# Patient Record
Sex: Female | Born: 1970 | Race: Black or African American | Hispanic: No | Smoking: Never smoker
Health system: Southern US, Community
[De-identification: ages and names within clinical notes are randomized; demographics above are authoritative.]

## PROBLEM LIST (undated history)

## (undated) DIAGNOSIS — K5790 Diverticulosis of intestine, part unspecified, without perforation or abscess without bleeding: Secondary | ICD-10-CM

## (undated) DIAGNOSIS — D649 Anemia, unspecified: Secondary | ICD-10-CM

## (undated) DIAGNOSIS — E119 Type 2 diabetes mellitus without complications: Secondary | ICD-10-CM

## (undated) DIAGNOSIS — I10 Essential (primary) hypertension: Secondary | ICD-10-CM

## (undated) DIAGNOSIS — K219 Gastro-esophageal reflux disease without esophagitis: Secondary | ICD-10-CM

## (undated) HISTORY — PX: ABDOMINAL HYSTERECTOMY: SHX81

## (undated) HISTORY — PX: WISDOM TOOTH EXTRACTION: SHX21

## (undated) HISTORY — PX: TUBAL LIGATION: SHX77

## (undated) HISTORY — DX: Diverticulosis of intestine, part unspecified, without perforation or abscess without bleeding: K57.90

## (undated) HISTORY — DX: Gastro-esophageal reflux disease without esophagitis: K21.9

## (undated) HISTORY — DX: Anemia, unspecified: D64.9

---

## 2013-10-26 ENCOUNTER — Encounter (HOSPITAL_COMMUNITY): Payer: Self-pay | Admitting: Emergency Medicine

## 2013-10-26 ENCOUNTER — Emergency Department (HOSPITAL_COMMUNITY)
Admission: EM | Admit: 2013-10-26 | Discharge: 2013-10-26 | Disposition: A | Discharge status: Home / Self Care | Attending: Emergency Medicine | Admitting: Emergency Medicine

## 2013-10-26 DIAGNOSIS — J329 Chronic sinusitis, unspecified: Secondary | ICD-10-CM | POA: Insufficient documentation

## 2013-10-26 DIAGNOSIS — K5900 Constipation, unspecified: Secondary | ICD-10-CM | POA: Insufficient documentation

## 2013-10-26 DIAGNOSIS — R1084 Generalized abdominal pain: Secondary | ICD-10-CM | POA: Insufficient documentation

## 2013-10-26 DIAGNOSIS — R51 Headache: Secondary | ICD-10-CM | POA: Insufficient documentation

## 2013-10-26 DIAGNOSIS — R109 Unspecified abdominal pain: Secondary | ICD-10-CM

## 2013-10-26 LAB — URINALYSIS, ROUTINE W REFLEX MICROSCOPIC
Leukocytes, UA: NEGATIVE
Nitrite: NEGATIVE
Specific Gravity, Urine: 1.025 (ref 1.005–1.030)
pH: 5 (ref 5.0–8.0)

## 2013-10-26 LAB — COMPREHENSIVE METABOLIC PANEL
ALT: 20 U/L (ref 0–35)
Albumin: 3.8 g/dL (ref 3.5–5.2)
BUN: 11 mg/dL (ref 6–23)
CO2: 23 mEq/L (ref 19–32)
Calcium: 9.1 mg/dL (ref 8.4–10.5)
Creatinine, Ser: 0.67 mg/dL (ref 0.50–1.10)
Total Protein: 7.5 g/dL (ref 6.0–8.3)

## 2013-10-26 LAB — LIPASE, BLOOD: Lipase: 16 U/L (ref 11–59)

## 2013-10-26 LAB — CBC
Hemoglobin: 13.3 g/dL (ref 12.0–15.0)
MCHC: 34.5 g/dL (ref 30.0–36.0)
Platelets: 316 10*3/uL (ref 150–400)
RBC: 4.77 MIL/uL (ref 3.87–5.11)

## 2013-10-26 MED ORDER — AMOXICILLIN 500 MG PO CAPS: 500.0000 mg | ORAL_CAPSULE | Freq: Three times a day (TID) | ORAL | Status: DC

## 2013-10-26 NOTE — ED Notes (Signed)
2 different EMT/NT attempted to collect all of pt's ordered labs

## 2013-10-26 NOTE — ED Notes (Signed)
EDP at the bedside.  ?

## 2013-10-26 NOTE — ED Provider Notes (Signed)
CSN: [id number]     Arrival date & time 10/26/13  1142 History   First MD Initiated Contact with Patient 10/26/13 1500     Chief Complaint  Patient presents with  . Abdominal Pain  . Headache   (Consider location/radiation/quality/duration/timing/severity/associated sxs/prior Treatment) Patient is a 42 y.o. female presenting with abdominal pain and headaches.  Abdominal Pain Headache Associated symptoms: abdominal pain    Pt with no significant PMH reports 2 weeks of nasal and sinus congestion with moderate aching headache similar to previous episodes of sinusitis and not improved with Sudafed. She denies fever or cough. Reports post-nasal drip particularly at night.   She has a secondary complaint of intermittent moderate aching upper abdominal pain, radiating around to her lower back onset this morning. No associated vomiting or diarrhea, does report mild constipation recently and some mild dysuria but no increased urinary frequency. She is s/p hysterectomy. Still has gall bladder.   History reviewed. No pertinent past medical history. History reviewed. No pertinent past surgical history. History reviewed. No pertinent family history. History  Substance Use Topics  . Smoking status: Never Smoker   . Smokeless tobacco: Not on file  . Alcohol Use: Yes     Comment: occ   OB History   Grav Para Term Preterm Abortions TAB SAB Ect Mult Living                 Review of Systems  Gastrointestinal: Positive for abdominal pain.  Neurological: Positive for headaches.   All other systems reviewed and are negative except as noted in HPI.   Allergies  Review of patient's allergies indicates no known allergies.  Home Medications   Current Outpatient Rx  Name  Route  Sig  Dispense  Refill  . pseudoephedrine (SUDAFED) 120 MG 12 hr tablet   Oral   Take 120 mg by mouth every 12 (twelve) hours as needed for congestion.          BP 133/72  Pulse 112  Temp(Src) 97.6 F (36.4 C)  (Oral)  Resp 18  Wt 200 lb (90.719 kg)  SpO2 96% Physical Exam  Nursing note and vitals reviewed. Constitutional: She is oriented to person, place, and time. She appears well-developed and well-nourished.  HENT:  Head: Normocephalic and atraumatic.  Nose: Mucosal edema and rhinorrhea present. Right sinus exhibits maxillary sinus tenderness. Left sinus exhibits maxillary sinus tenderness.  Eyes: EOM are normal. Pupils are equal, round, and reactive to light.  Neck: Normal range of motion. Neck supple.  Cardiovascular: Normal rate, normal heart sounds and intact distal pulses.   Pulmonary/Chest: Effort normal and breath sounds normal.  Abdominal: Bowel sounds are normal. She exhibits no distension. There is tenderness (mild, diffuse). There is no rebound and no guarding.  Musculoskeletal: Normal range of motion. She exhibits no edema and no tenderness.  Neurological: She is alert and oriented to person, place, and time. She has normal strength. No cranial nerve deficit or sensory deficit.  Skin: Skin is warm and dry. No rash noted.  Psychiatric: She has a normal mood and affect.    ED Course  Procedures (including critical care time) Labs Review Labs Reviewed  COMPREHENSIVE METABOLIC PANEL - Abnormal; Notable for the following:    Sodium 134 (*)    All other components within normal limits  CBC - Abnormal; Notable for the following:    WBC 13.2 (*)    All other components within normal limits  LIPASE, BLOOD  URINALYSIS, ROUTINE W REFLEX MICROSCOPIC  CBC WITH DIFFERENTIAL   Imaging Review No results found.  EKG Interpretation   None       MDM   1. Sinusitis   2. Abdominal pain   3. Constipation     Labs unremarkable, abdomen benign, doubt acute surgical process. Likely sinusitis given duration of symptoms, will start Amoxil. Recommend continued decongestants, stool softeners for constipation and PCP followup.     Charles B. Bernette Mayers, MD 10/26/13 [phone]

## 2013-10-26 NOTE — ED Notes (Signed)
Pt c/o epigastric pain starting this am; pt sts some head congestion and HA x 1 week; pt denies cough

## 2013-10-26 NOTE — ED Notes (Signed)
Called lab to check on CBC. Informed it was not run yet, lab to run the specimen now.

## 2014-11-21 ENCOUNTER — Other Ambulatory Visit: Payer: Self-pay | Admitting: Family Medicine

## 2014-11-21 DIAGNOSIS — Z1231 Encounter for screening mammogram for malignant neoplasm of breast: Secondary | ICD-10-CM

## 2014-12-01 ENCOUNTER — Ambulatory Visit
Admission: RE | Admit: 2014-12-01 | Discharge: 2014-12-01 | Disposition: A | Discharge status: Home / Self Care | Source: Ambulatory Visit | Attending: Family Medicine | Admitting: Family Medicine

## 2014-12-01 DIAGNOSIS — Z1231 Encounter for screening mammogram for malignant neoplasm of breast: Secondary | ICD-10-CM

## 2014-12-02 ENCOUNTER — Emergency Department (HOSPITAL_COMMUNITY)
Admission: EM | Admit: 2014-12-02 | Discharge: 2014-12-02 | Disposition: A | Discharge status: Home / Self Care | Attending: Emergency Medicine | Admitting: Emergency Medicine

## 2014-12-02 ENCOUNTER — Encounter (HOSPITAL_COMMUNITY): Payer: Self-pay

## 2014-12-02 DIAGNOSIS — K088 Other specified disorders of teeth and supporting structures: Secondary | ICD-10-CM | POA: Diagnosis not present

## 2014-12-02 DIAGNOSIS — I10 Essential (primary) hypertension: Secondary | ICD-10-CM | POA: Diagnosis not present

## 2014-12-02 DIAGNOSIS — Z792 Long term (current) use of antibiotics: Secondary | ICD-10-CM | POA: Diagnosis not present

## 2014-12-02 DIAGNOSIS — K0889 Other specified disorders of teeth and supporting structures: Secondary | ICD-10-CM

## 2014-12-02 HISTORY — DX: Essential (primary) hypertension: I10

## 2014-12-02 MED ORDER — PENICILLIN V POTASSIUM 500 MG PO TABS: 500.0000 mg | ORAL_TABLET | Freq: Four times a day (QID) | ORAL | Status: AC

## 2014-12-02 MED ORDER — OXYCODONE-ACETAMINOPHEN 5-325 MG PO TABS: 1.0000 | ORAL_TABLET | Freq: Three times a day (TID) | ORAL | Status: DC | PRN

## 2014-12-02 NOTE — ED Provider Notes (Signed)
CSN: [id number]     Arrival date & time 12/02/14  1505 History   First MD Initiated Contact with Patient 12/02/14 1537     Chief Complaint  Patient presents with  . Dental Pain     (Consider location/radiation/quality/duration/timing/severity/associated sxs/prior Treatment) Patient is a 43 y.o. female presenting with tooth pain. The history is provided by the patient.  Dental Pain Location:  Generalized Quality:  Dull and aching Severity:  Mild Onset quality:  Gradual Duration:  1 month Timing:  Intermittent Progression:  Worsening Chronicity:  New Context comment:  Wisdom teeth Relieved by:  Nothing Worsened by:  Nothing tried Ineffective treatments:  None tried Associated symptoms: no congestion, no drooling, no fever, no headaches and no neck pain     Past Medical History  Diagnosis Date  . Hypertension    Past Surgical History  Procedure Laterality Date  . Abdominal hysterectomy     History reviewed. No pertinent family history. History  Substance Use Topics  . Smoking status: Never Smoker   . Smokeless tobacco: Not on file  . Alcohol Use: Yes     Comment: occ   OB History    No data available     Review of Systems  Constitutional: Negative for fever and fatigue.  HENT: Negative for congestion and drooling.   Eyes: Negative for pain.  Respiratory: Negative for cough and shortness of breath.   Cardiovascular: Negative for chest pain.  Gastrointestinal: Negative for nausea, vomiting, abdominal pain and diarrhea.  Genitourinary: Negative for dysuria and hematuria.  Musculoskeletal: Negative for back pain, gait problem and neck pain.  Skin: Negative for color change.  Neurological: Negative for dizziness and headaches.  Hematological: Negative for adenopathy.  Psychiatric/Behavioral: Negative for behavioral problems.  All other systems reviewed and are negative.     Allergies  Review of patient's allergies indicates no known allergies.  Home  Medications   Prior to Admission medications   Medication Sig Start Date End Date Taking? Authorizing Provider  amoxicillin (AMOXIL) 500 MG capsule Take 1 capsule (500 mg total) by mouth 3 (three) times daily. 10/26/13   Charles B. Bernette MayersSheldon, MD  pseudoephedrine (SUDAFED) 120 MG 12 hr tablet Take 120 mg by mouth every 12 (twelve) hours as needed for congestion.    Historical Provider, MD   BP 159/99 mmHg  Pulse 81  Temp(Src) 97.7 F (36.5 C) (Oral)  Resp 20  SpO2 100% Physical Exam  Constitutional: She is oriented to person, place, and time. She appears well-developed and well-nourished.  HENT:  Head: Normocephalic.  Mouth/Throat: Oropharynx is clear and moist. No oropharyngeal exudate.  No trismus. Normal-appearing dentition. No intraoral abscess noted. Normal-appearing posterior oropharynx.  Eyes: Conjunctivae and EOM are normal. Pupils are equal, round, and reactive to light.  Neck: Normal range of motion. Neck supple.  Cardiovascular: Normal rate, regular rhythm, normal heart sounds and intact distal pulses.  Exam reveals no gallop and no friction rub.   No murmur heard. Pulmonary/Chest: Effort normal and breath sounds normal. No respiratory distress. She has no wheezes.  Abdominal: Soft. Bowel sounds are normal. There is no tenderness. There is no rebound and no guarding.  Musculoskeletal: Normal range of motion. She exhibits no edema or tenderness.  Neurological: She is alert and oriented to person, place, and time.  Skin: Skin is warm and dry.  Psychiatric: She has a normal mood and affect. Her behavior is normal.  Nursing note and vitals reviewed.   ED Course  Procedures (including critical care  time) Labs Review Labs Reviewed - No data to display  Imaging Review No results found.   EKG Interpretation None      MDM   Final diagnoses:  Pain, dental    3:50 PM 43 y.o. female who presents with diffuse dental pain. She states that she has had teeth aching for the  last month. She has follow-up with a dentist but does not have dental insurance. She is currently waiting until January 2016 to get her wisdom teeth extracted. She denies any fevers. Her vital signs are unremarkable here. She has a normal dental exam. Will recommend she follow back up with her dentist. Will provide resources.  3:52 PM:  I have discussed the diagnosis/risks/treatment options with the patient and believe the pt to be eligible for discharge home to follow-up with her dentist. We also discussed returning to the ED immediately if new or worsening sx occur. We discussed the sx which are most concerning (e.g., worsening pain, swelling, fever) that necessitate immediate return. Medications administered to the patient during their visit and any new prescriptions provided to the patient are listed below.  Medications given during this visit Medications - No data to display  New Prescriptions   OXYCODONE-ACETAMINOPHEN (PERCOCET) 5-325 MG PER TABLET    Take 1 tablet by mouth every 8 (eight) hours as needed for moderate pain.   PENICILLIN V POTASSIUM (VEETID) 500 MG TABLET    Take 1 tablet (500 mg total) by mouth 4 (four) times daily.       Purvis SheffieldForrest Osceola Depaz, MD 12/02/14 901 [phone]

## 2014-12-02 NOTE — ED Notes (Signed)
Per pt, dental pain started today but has had pain on and off.  Pt called dentist but they were closing and told her to come here.

## 2014-12-02 NOTE — Discharge Instructions (Signed)
Dental Pain °A tooth ache may be caused by cavities (tooth decay). Cavities expose the nerve of the tooth to air and hot or cold temperatures. It may come from an infection or abscess (also called a boil or furuncle) around your tooth. It is also often caused by dental caries (tooth decay). This causes the pain you are having. °DIAGNOSIS  °Your caregiver can diagnose this problem by exam. °TREATMENT  °· If caused by an infection, it may be treated with medications which kill germs (antibiotics) and pain medications as prescribed by your caregiver. Take medications as directed. °· Only take over-the-counter or prescription medicines for pain, discomfort, or fever as directed by your caregiver. °· Whether the tooth ache today is caused by infection or dental disease, you should see your dentist as soon as possible for further care. °SEEK MEDICAL CARE IF: °The exam and treatment you received today has been provided on an emergency basis only. This is not a substitute for complete medical or dental care. If your problem worsens or new problems (symptoms) appear, and you are unable to meet with your dentist, call or return to this location. °SEEK IMMEDIATE MEDICAL CARE IF:  °· You have a fever. °· You develop redness and swelling of your face, jaw, or neck. °· You are unable to open your mouth. °· You have severe pain uncontrolled by pain medicine. °MAKE SURE YOU:  °· Understand these instructions. °· Will watch your condition. °· Will get help right away if you are not doing well or get worse. °Document Released: 12/02/2005 Document Revised: 02/24/2012 Document Reviewed: 07/20/2008 °ExitCare® Patient Information ©2015 [employer]. This information is not intended to replace advice given to you by your health care provider. Make sure you discuss any questions you have with your health care provider. ° °Emergency Department Resource Guide °1) Find a Doctor and Pay Out of Pocket °Although you won't have to find out who  is covered by your insurance plan, it is a good idea to ask around and get recommendations. You will then need to call the office and see if the doctor you have chosen will accept you as a new patient and what types of options they offer for patients who are self-pay. Some doctors offer discounts or will set up payment plans for their patients who do not have insurance, but you will need to ask so you aren't surprised when you get to your appointment. ° °2) Contact Your Local Health Department °Not all health departments have doctors that can see patients for sick visits, but many do, so it is worth a call to see if yours does. If you don't know where your local health department is, you can check in your phone book. The CDC also has a tool to help you locate your state's health department, and many state websites also have listings of all of their local health departments. ° °3) Find a Walk-in Clinic °If your illness is not likely to be very severe or complicated, you may want to try a walk in clinic. These are popping up all over the country in pharmacies, drugstores, and shopping centers. They're usually staffed by nurse practitioners or physician assistants that have been trained to treat common illnesses and complaints. They're usually fairly quick and inexpensive. However, if you have serious medical issues or chronic medical problems, these are probably not your best option. ° °No Primary Care Doctor: °- Call Health Connect at  832-8000 - they can help you locate a primary   care doctor that  accepts your insurance, provides certain services, etc. °- Physician Referral Service- 1-800-533-3463 ° °Chronic Pain Problems: °Organization         Address  Phone   Notes  °[facility]  (336) 297-2271 Patients need to be referred by their primary care doctor.  ° °Medication Assistance: °Organization         Address  Phone   Notes  °[place] [facility] [address]  [address] °[place], [place] [address] (336) 641-8030 --Must be a resident of [place] °-- Must have NO insurance coverage whatsoever (no Medicaid/ Medicare, etc.) °-- The pt. MUST have a primary care doctor that directs their care regularly and follows them in the community °  °[facility]  (866) 331-1348   °[employer]  (888) 892-1162   ° °Agencies that provide inexpensive medical care: °Organization         Address  Phone   Notes  °[facility]  (336) 832-8035   °[facility]    (336) 832-7272   °[facility] [address] °[place], [place] [address] (336) 832-4777   °[facility] [address], °[place] (336) 271-4999   °[facility]    (336) 373-0678   °[facility]    (336) 272-1050   °[facility] ° [address], [place] Phone:  (336) 832-4444, Fax:  (336) 832-4440 Hours of Operation:  9 am - 6 pm, M-F.  Also accepts Medicaid/Medicare and self-pay.  °[facility] ° [address], [place] Phone: (336) 832-3150, Fax: (336) 832-3151. Hours of Operation:  8:30 am - 5:30 pm, M-F.  Also accepts Medicaid and self-pay.  °[facility] [place] [address], [place] Phone: (336) 878-6027   °[facility] [address], [place], [place] (336)723-1848, Ext. 123 Mondays & Thursdays: 7-9 AM.  First 15 patients are seen on a first come, first serve basis. °  ° °Medicaid-accepting [place] Providers: ° °Organization         Address  Phone   Notes  °[facility] [address], [place] (336) 641-2100 Also accepts self-pay patients.  °[facility] [address], [place] ° (336) 856-9996   °[facility] [address], [place] (336) 288-8857   °[facility] [address] [place] [address], [place] (336) 299-7000   °Veita Bland [address], [place]  ° (336) 373-1557 Only accepts [place] Access Medicaid patients after they have their name applied to their card.  ° °Self-Pay (no insurance) in [place]: ° °Organization         Address  Phone   Notes  °Sickle Cell Patients, [facility] [address], [place] (336) 832-1970   °[facility] [address], [place] (336) 832-4400   °[facility]  ° [address],  (336) 992-4800   °[facility]/Dr. Osei-Bonsu ° [address] [place] [address], [place] or [address], [place] (336) 841-8500 Phone number for both [place] and [place] locations is the same.  °[facility] [address], [place] (336) 299-0000   °[facility] [address] [place] [address],  or [address] (336) 852-7530 °(336) 878-2260   °  [facility] [address], [place] (336) 350-1642, phone; (336) 294-5005, fax Sees patients 1st and 3rd Saturday of every month.  Must not qualify for public or private insurance (i.e. Medicaid, Medicare, [place] Health Choice, Veterans' Benefits) • Household income should be no more than 200% of the poverty level •The clinic cannot treat you if you are pregnant or think you are pregnant • Sexually transmitted diseases are not treated at the clinic.  ° ° °Dental Care: °Organization         Address  Phone  Notes  °[facility] [address], [place] (336) 641-6152 Accepts children up to age 21 who are enrolled in Medicaid or [place] Health Choice; pregnant women with a Medicaid card; and children who have applied for Medicaid or [place] Health Choice, but were declined, whose parents can pay a reduced fee at time of service.  °[facility] [place]  [address], [place] (336) 641-7733 Accepts children up to age 21 who are  enrolled in Medicaid or [place] Health Choice; pregnant women with a Medicaid card; and children who have applied for Medicaid or [place] Health Choice, but were declined, whose parents can pay a reduced fee at time of service.  °[facility] ° [address], [place] (336) 641-4533 Patients are seen by appointment only. Walk-ins are not accepted. [facility] will see patients 18 years of age and older. °Monday - Tuesday (8am-5pm) °Most Wednesdays (8:30-5pm) °$30 per visit, cash only  °[facility] ° [address], [place] (336) 641-4533 Patients are seen by appointment only. Walk-ins are not accepted. [facility] will see patients 18 years of age and older. °One Wednesday Evening (Monthly: Volunteer Based).  $30 per visit, cash only  °[facility]  (919) 537-3737 for adults; Children under age 4, call Graduate Pediatric Dentistry at (919) [phone]. Children aged 4-14, please call (919) [phone] to request a pediatric application. ° Dental services are provided in all areas of dental care including fillings, crowns and bridges, complete and partial dentures, implants, gum treatment, root canals, and extractions. Preventive care is also provided. Treatment is provided to both adults and children. °Patients are selected via a lottery and there is often a waiting list. °  °[facility] [address], °[place] ° (336) 763-8833 [email or web address] °  °[facility] [address], [place], [place] (336)723-1848, Ext. 123 Second and Fourth Thursday of each month, opens at 6:30 AM; Clinic ends at 9 AM.  Patients are seen on a first-come first-served basis, and a limited number are seen during each clinic.  ° °[facility] ° [address], [place], [place] (336) 723-7904   Eligibility Requirements °You must have lived in [place], [place], or [place] counties for at least the last three months. °  You  cannot be eligible for state or federal sponsored healthcare insurance, including Veterans Administration, Medicaid, or Medicare. °  You generally cannot be eligible for healthcare insurance through your employer.  °  How to apply: °Eligibility screenings are held every Tuesday and Wednesday afternoon from 1:00 pm until 4:00 pm. You do not need an appointment for the interview!  °[facility] [address], [place],  336-631-2330   °[facility]  336-342-8273   °[facility]  336-703-3100   °[facility]  336-570-6415   ° °Behavioral Health Resources in the Community: °Intensive Outpatient Programs °Organization         Address  Phone  Notes  °[facility] [address], [place], [place] 336-878-6098   °[facility] [address], [place], [place] 336-832-9800   °[facility] [address], [place], [place] ° 336-882-2125   °[facility] [address],  °[place], [place] 1-800-853-5163 or 336-641-4981   °Substance Abuse Resources °Organization         Address  Phone  Notes  °[facility]  336-882-2125   °[facility]  336-784-9470   °[facility]  336-285-9073   °[facility]  336-845-3988   °Residential & Outpatient Substance Abuse Program  1-800-659-3381   °Psychological Services °Organization         Address  Phone  Notes  °[facility]  336- 832-9600   °[facility]  336- 378-7881   °[facility] [address], [place] 1-800-853-5163 or 336-641-4981   ° °Mobile Crisis Teams °Organization         Address  Phone  Notes  °[facility], Mobile Crisis Care Unit  1-877-626-1772   °Assertive °[facility] ° [address] [place], [place] 336-834-9664   °Sharon DeEsch [address] °[place] 336-554-5454   ° °Self-Help/Support  Groups °Organization         Address  Phone             Notes  °[facility] [place] - variety of support groups  336- 373-1402 Call for more information  °Narcotics Anonymous (NA), [facility] [address], °[place]  2 meetings at this location  ° °Residential Treatment Programs °Organization         Address  Phone  Notes  °[facility] [address],    °[place]  1-866-801-8205   °[facility] ° [address], [place], [place] 704-293-8524   °[facility] [address], [place] 336-845-3988 Admissions: 8am-3pm M-F  °[facility] [address]    °[place], [place] 336-841-1104   °[facility] [address], [place], [place] 336-379-7146   °[facility] [address]  °[place], [place] 336-285-9073   °[facility] - Intensive Outpatient [address], [place], [place] 336-852-3033   °[facility] [address]  °[place], [place] 1-877-615-2722 or 336-784-9470   °[facility] [address] [place], [place] 336-227-7417 Accepts Medicaid  °[facility] [address]  ° [place] 1-800-659-3381 Substance Abuse/Addiction Treatment  ° °[place] Behavioral Health Resources °Organization         Address  Phone  Notes  °[facility]  (888) 581-9988   °Julie Brannon, PhD [address] [place], [place]   (336) 349-5553 or (336) 951-0000   °[facility]   [address] °[place], [place] (336) 349-4454   °[facility] [address], [place], [place] (336) 342-8316 Insurance/Medicaid/sponsorship through [facility]  °[facility] [address]                                    [place],  (336) 342-8316 Therapy/tele-psych/case  °[facility]   [address]  ° [place], [place] (336) 349-2233    °Dr. Arfeen  (336) 349-4544   °[facility] [place]  [facility]  [employer] [place] [facility] 1) [address], [place] °2) [address], [place] °3)  [address], [place] (336) 349-3220 °(336) 342-7768 ° °(336) 342-8140   °[place] Child Abuse Hotline (336) 342-1394 or (336) 342-3537 (After Hours)    ° ° ° °

## 2014-12-13 ENCOUNTER — Other Ambulatory Visit: Payer: Self-pay | Admitting: Family Medicine

## 2014-12-13 DIAGNOSIS — R928 Other abnormal and inconclusive findings on diagnostic imaging of breast: Secondary | ICD-10-CM

## 2014-12-26 ENCOUNTER — Ambulatory Visit
Admission: RE | Admit: 2014-12-26 | Discharge: 2014-12-26 | Disposition: A | Discharge status: Home / Self Care | Source: Ambulatory Visit | Attending: Family Medicine | Admitting: Family Medicine

## 2014-12-26 DIAGNOSIS — R928 Other abnormal and inconclusive findings on diagnostic imaging of breast: Secondary | ICD-10-CM

## 2015-01-02 DIAGNOSIS — K5790 Diverticulosis of intestine, part unspecified, without perforation or abscess without bleeding: Secondary | ICD-10-CM

## 2015-01-02 HISTORY — DX: Diverticulosis of intestine, part unspecified, without perforation or abscess without bleeding: K57.90

## 2015-01-23 DIAGNOSIS — K219 Gastro-esophageal reflux disease without esophagitis: Secondary | ICD-10-CM

## 2015-01-23 HISTORY — DX: Gastro-esophageal reflux disease without esophagitis: K21.9

## 2015-06-06 ENCOUNTER — Other Ambulatory Visit: Payer: Self-pay | Admitting: Family Medicine

## 2015-06-06 DIAGNOSIS — E049 Nontoxic goiter, unspecified: Secondary | ICD-10-CM

## 2015-06-09 ENCOUNTER — Ambulatory Visit
Admission: RE | Admit: 2015-06-09 | Discharge: 2015-06-09 | Disposition: A | Discharge status: Home / Self Care | Source: Ambulatory Visit | Attending: Family Medicine | Admitting: Family Medicine

## 2015-06-09 DIAGNOSIS — E049 Nontoxic goiter, unspecified: Secondary | ICD-10-CM

## 2015-06-13 ENCOUNTER — Other Ambulatory Visit: Payer: Self-pay | Admitting: Family Medicine

## 2015-06-13 DIAGNOSIS — E041 Nontoxic single thyroid nodule: Secondary | ICD-10-CM

## 2015-07-06 ENCOUNTER — Encounter: Payer: Self-pay | Admitting: Physician Assistant

## 2015-07-07 ENCOUNTER — Other Ambulatory Visit: Payer: Self-pay | Admitting: Cardiology

## 2015-07-07 DIAGNOSIS — R079 Chest pain, unspecified: Secondary | ICD-10-CM

## 2015-07-12 ENCOUNTER — Other Ambulatory Visit (HOSPITAL_COMMUNITY)
Admission: RE | Admit: 2015-07-12 | Discharge: 2015-07-12 | Disposition: A | Discharge status: Home / Self Care | Source: Ambulatory Visit | Attending: Interventional Radiology | Admitting: Interventional Radiology

## 2015-07-12 ENCOUNTER — Ambulatory Visit
Admission: RE | Admit: 2015-07-12 | Discharge: 2015-07-12 | Disposition: A | Discharge status: Home / Self Care | Source: Ambulatory Visit | Attending: Family Medicine | Admitting: Family Medicine

## 2015-07-12 DIAGNOSIS — E041 Nontoxic single thyroid nodule: Secondary | ICD-10-CM

## 2015-07-14 ENCOUNTER — Encounter (HOSPITAL_COMMUNITY)
Admission: RE | Admit: 2015-07-14 | Discharge: 2015-07-14 | Disposition: A | Discharge status: Home / Self Care | Source: Ambulatory Visit | Attending: Cardiology | Admitting: Cardiology

## 2015-07-14 DIAGNOSIS — I10 Essential (primary) hypertension: Secondary | ICD-10-CM | POA: Insufficient documentation

## 2015-07-14 DIAGNOSIS — R079 Chest pain, unspecified: Secondary | ICD-10-CM | POA: Diagnosis not present

## 2015-07-14 MED ORDER — TECHNETIUM TC 99M SESTAMIBI GENERIC - CARDIOLITE
30.0000 | Freq: Once | INTRAVENOUS | Status: AC | PRN
  Administered 2015-07-14: 30 via INTRAVENOUS

## 2015-07-14 MED ORDER — TECHNETIUM TC 99M SESTAMIBI GENERIC - CARDIOLITE
10.0000 | Freq: Once | INTRAVENOUS | Status: AC | PRN
  Administered 2015-07-14: 10 via INTRAVENOUS

## 2015-07-17 ENCOUNTER — Encounter: Payer: Self-pay | Admitting: *Deleted

## 2015-07-21 ENCOUNTER — Encounter: Payer: Self-pay | Admitting: Physician Assistant

## 2015-07-21 ENCOUNTER — Ambulatory Visit (INDEPENDENT_AMBULATORY_CARE_PROVIDER_SITE_OTHER): Admitting: Physician Assistant

## 2015-07-21 VITALS — BP 124/70 | HR 62 | Ht 62.0 in | Wt 188.0 lb

## 2015-07-21 DIAGNOSIS — K219 Gastro-esophageal reflux disease without esophagitis: Secondary | ICD-10-CM | POA: Diagnosis not present

## 2015-07-21 DIAGNOSIS — K5730 Diverticulosis of large intestine without perforation or abscess without bleeding: Secondary | ICD-10-CM

## 2015-07-21 DIAGNOSIS — R1310 Dysphagia, unspecified: Secondary | ICD-10-CM

## 2015-07-21 NOTE — Patient Instructions (Addendum)
You have been scheduled for a Barium Esophogram at [facility] [address] [facility]  on 07/25/2015 at 10:40 for an 11am appointment. Please arrive 15 minutes prior to your appointment for registration. Make certain not to have anything to eat or drink 6 hours prior to your test. If you need to reschedule for any reason, please contact radiology at [phone] to do so. __________________________________________________________________ A barium swallow is an examination that concentrates on views of the esophagus. This tends to be a double contrast exam (barium and two liquids which, when combined, create a gas to distend the wall of the oesophagus) or single contrast (non-ionic iodine based). The study is usually tailored to your symptoms so a good history is essential. Attention is paid during the study to the form, structure and configuration of the esophagus, looking for functional disorders (such as aspiration, dysphagia, achalasia, motility and reflux) EXAMINATION You may be asked to change into a gown, depending on the type of swallow being performed. A radiologist and radiographer will perform the procedure. The radiologist will advise you of the type of contrast selected for your procedure and direct you during the exam. You will be asked to stand, sit or lie in several different positions and to hold a small amount of fluid in your mouth before being asked to swallow while the imaging is performed .In some instances you may be asked to swallow barium coated marshmallows to assess the motility of a solid food bolus. The exam can be recorded as a digital or video fluoroscopy procedure. POST PROCEDURE It will take 1-2 days for the barium to pass through your system. To facilitate this, it is important, unless otherwise directed, to increase your fluids for the next 24-48hrs and to resume your normal diet.  This test typically takes about 30 minutes to  perform. __________________________________________________________________________________  Kelly Cooper have been scheduled for an endoscopy. Please follow written instructions given to you at your visit today. If you use inhalers (even only as needed), please bring them with you on the day of your procedure. Your physician has requested that you go to www.startemmi.com and enter the access code given to you at your visit today. This web site gives a general overview about your procedure. However, you should still follow specific instructions given to you by our office regarding your preparation for the procedure.

## 2015-07-23 ENCOUNTER — Encounter: Payer: Self-pay | Admitting: Physician Assistant

## 2015-07-23 DIAGNOSIS — R1310 Dysphagia, unspecified: Secondary | ICD-10-CM | POA: Insufficient documentation

## 2015-07-23 DIAGNOSIS — K5730 Diverticulosis of large intestine without perforation or abscess without bleeding: Secondary | ICD-10-CM | POA: Insufficient documentation

## 2015-07-23 NOTE — Progress Notes (Signed)
Agree with initial assessment and plans 

## 2015-07-23 NOTE — Progress Notes (Signed)
Patient ID: Kelly Cooper, female   DOB: 1971/07/04, 44 y.o.   MRN: [id number] StandleyPI:  Kelly Cooper is a 44 y.o.   female  referred by Lewis Moccasin, MD  for evaluation of GERD and recent episode of diverticulitis. Patient has a past history of GERD and hypertension and has had an abdominal hysterectomy, tubal ligation, and extraction of wisdom teeth.  She states that in January 2016 she was evaluated at [facility] for lower abdominal pain. She had a CT scan that showed uncomplicated diverticulitis. She was prescribed a course of Flagyl and Cipro for 10 days. With these medications, she had resolution of her pain. She now has intermittent crampy pain prior to bowel movements, alleviated with defecation. She has had scant amounts of blood on the toilet tissue only if she strains. She has never had a colonoscopy. She is not aware of a family history of inflammatory bowel disease, and states her maternal grandfather had colon cancer at 65.  She also reports that she has been having intermittent chest pain for several months. She has been experiencing difficulty swallowing solids and dry foods and has to stop and let her food pass. She frequently gets mouthfuls of bitter regurgitation. She denies dysphagia to liquids and does not have a nocturnal cough, she has a bad taste in her mouth every morning. She had tried Prevacid and Prilosec in the past with no relief. She was then prescribed DEXA Micah Noel but her co-pay was too high for her to afford it. She has never had an EGD. She denies a family history of esophageal cancer. She denies use of tobacco or intravenous drugs and admits to rare use of alcohol. She has been experiencing epigastric pain that is worse after meals. She is full after 1 or 2 bites and has lost 23 pounds over 2 months. Through this time she states she had her wisdom teeth pulled, her episode of diverticulitis, and an allergy to a blood pressure medication. She admits to using Goody powders 5  or 6 times per week. She had a thyroid cyst biopsied and drained last week at [facility] imaging, and has not noted any decrease in her dysphagia since then.  Past Medical History  Diagnosis Date  . Hypertension   . GERD (gastroesophageal reflux disease) 01/23/2015  . Diverticulosis of intestine 01/02/2015  . Anemia     Past Surgical History  Procedure Laterality Date  . Abdominal hysterectomy      partial   . Tubal ligation    . Wisdom tooth extraction      one tooth removed   Family History  Problem Relation Age of Onset  . Diabetes Maternal Grandfather   . Diabetes Paternal Aunt   . Diabetes Paternal Uncle   . Kidney disease Father   . Lung cancer Father   . Colon cancer Maternal Grandfather   . Prostate cancer Maternal Grandfather    History  Substance Use Topics  . Smoking status: Never Smoker   . Smokeless tobacco: Not on file  . Alcohol Use: 0.0 oz/week    0 Standard drinks or equivalent per week     Comment: occ   Current Outpatient Prescriptions  Medication Sig Dispense Refill  . esomeprazole (NEXIUM) 40 MG capsule Take 1 capsule (40 mg total) by mouth daily at 12 noon. 30 minutes before breakfast 30 capsule 3   No current facility-administered medications for this visit.   Allergies  Allergen Reactions  . Losartan Cough  Hoarseness and coughing and weight loss     Review of Systems: Gen: Denies any fever, chills, sweats, anorexia, fatigue, weakness, malaise, and sleep disorder CV: Denies chest pain, angina, palpitations, syncope, orthopnea, PND, peripheral edema, and claudication. Resp: Denies dyspnea at rest, dyspnea with exercise, cough, sputum, wheezing, coughing up blood, and pleurisy. GI: Denies vomiting blood, jaundice, and fecal incontinence.   Has dysphagia to solids. Has early satiety. GU : Denies urinary burning, blood in urine, urinary frequency, urinary hesitancy, nocturnal urination, and urinary incontinence. MS: Denies joint pain,  limitation of movement, and swelling, stiffness, low back pain, extremity pain. Denies muscle weakness, cramps, atrophy.  Derm: Denies rash, itching, dry skin, hives, moles, warts, or unhealing ulcers.  Psych: Denies depression, anxiety, memory loss, suicidal ideation, hallucinations, paranoia, and confusion. Heme: Denies bruising, bleeding, and enlarged lymph nodes. Neuro:  Denies any headaches, dizziness, paresthesias. Endo:  Denies any problems with DM, thyroid, adrenal function  Studies: Nm Myocar Multi W/spect W/wall Motion / Ef  07/14/2015   CLINICAL DATA:  44 year old female with unspecified chest pain. Additional risk factors include hypertension.  EXAM: MYOCARDIAL IMAGING WITH SPECT (REST AND EXERCISE)  GATED LEFT VENTRICULAR WALL MOTION STUDY  LEFT VENTRICULAR EJECTION FRACTION  TECHNIQUE: Standard myocardial SPECT imaging was performed after resting intravenous injection of 10 mCi Tc-19m sestamibi. Subsequently, exercise tolerance test was performed by the patient under the supervision of the Cardiology staff. At peak-stress, 30 mCi Tc-43m sestamibi was injected intravenously and standard myocardial SPECT imaging was performed. Quantitative gated imaging was also performed to evaluate left ventricular wall motion, and estimate left ventricular ejection fraction.  COMPARISON:  None.  FINDINGS: Perfusion: No decreased activity in the left ventricle on stress imaging to suggest reversible ischemia or infarction.  Wall Motion: Normal left ventricular wall motion. No left ventricular dilation.  Left Ventricular Ejection Fraction: 76 %  End diastolic volume 52 ml  End systolic volume 13 ml  IMPRESSION: 1. No reversible ischemia or infarction.  2. Normal left ventricular wall motion.  3. Left ventricular ejection fraction 76%  4. Low-risk stress test findings*.  *2012 Appropriate Use Criteria for Coronary Revascularization Focused Update: J Am Coll Cardiol. 2012;59(9):857-881.  http://content.dementiazones.com.aspx?articleid=[email or web address]   Electronically Signed   By: Malachy Moan M.D.   On: 07/14/2015 14:57   US Thyroid Biopsy  07/12/2015   CLINICAL DATA:  44 year old female with a history of cystic lesion of the right thyroid. She has been referred for biopsy.  EXAM: ULTRASOUND GUIDED NEEDLE ASPIRATE BIOPSY OF THE THYROID GLAND  COMPARISON:  Ultrasound 06/09/2015  PROCEDURE: The procedure, risks, benefits, and alternatives were explained to the patient. Questions regarding the procedure were encouraged and answered. The patient understands and consents to the procedure.  Ultrasound survey was performed with images stored and sent to PACs.  The right neck was prepped with Betadine in a sterile fashion, and a sterile drape was applied covering the operative field. A sterile gown and sterile gloves were used for the procedure. Local anesthesia was provided with 1% Lidocaine.  Ultrasound guidance was used to infiltrate the region with 1% lidocaine for local anesthesia. Four separate 25 gauge fine needle biopsy were then acquired of the margin of the cystic right thyroid nodule using ultrasound guidance. In addition, a single 18 gauge needle was passed into the cystic portion with aspiration or proximal 10 cc of dark fluid. Images were stored.  Slide preparation was performed.  Final image was stored after biopsy.  Patient tolerated the procedure well and  remained hemodynamically stable throughout.  No complications were encountered and no significant blood loss was encounter  COMPLICATIONS: None.  FINDINGS: Ultrasound survey demonstrates cystic lesion of the right thyroid.  Images during the case demonstrate needle tip within the thyroid tissue on each pass.  Final image demonstrates collapse of the cystic lesion, with no complicating features.  IMPRESSION: Status post ultrasound-guided biopsy of right-sided cystic thyroid nodule, also with aspiration of cystic contents. Tissue  specimen sent to pathology for complete histopathologic analysis.  Signed,  Yvone Neu. Loreta Ave, DO  [facility]  [facility]   Electronically Signed   By: Gilmer Mor D.O.   On: 07/12/2015 12:19    LAB RESULTS: CBC 12/28/2014 white count 10.5, hemoglobin 12.8, hematocrit 39.8, platelets 342,000.  Physical Exam: BP 124/70 mmHg  Pulse 62  Ht 5\' 2"  (1.575 m)  Wt 188 lb (85.276 kg)  BMI 34.38 kg/m2 Constitutional: Pleasant,well-developed,female in no acute distress. HEENT: Normocephalic and atraumatic. Conjunctivae are normal. No scleral icterus. Neck supple.  Cardiovascular: Normal rate, regular rhythm.  Pulmonary/chest: Effort normal and breath sounds normal. No wheezing, rales or rhonchi. Abdominal: Soft, nondistended, nontender. Bowel sounds active throughout. There are no masses palpable. No hepatomegaly. Rectal: Brown stool heme negative Extremities: no edema Lymphadenopathy: No cervical adenopathy noted. Neurological: Alert and oriented to person place and time. Skin: Skin is warm and dry. No rashes noted. Psychiatric: Normal mood and affect. Behavior is normal.  ASSESSMENT AND PLAN: #1. Dysphagia. Possibly secondary to poorly controlled reflux. An antireflux regimen has been reviewed and she will be given a trial of Nexium 40 mg 1 by mouth every morning 30 minutes prior to breakfast. She will be scheduled for a barium swallow with tablet to evaluate for possible stricture, esophageal wall thickening, etc. She will then be scheduled for an EGD to evaluate for esophagitis, gastritis, ulcer, etc.The risks, benefits, and alternatives to endoscopy with possible biopsy and possible dilation were discussed with the patient and they consent to proceed.   #2. Diverticulosis. She's been instructed to adhere to a high-fiber, low-fat diet. She's been instructed to avoid constipation. She has signed a medical release to get records from [facility].  Further  recommendations will be made pending the findings of the above.    Dakotah Orrego, Tollie Pizza PA-C 07/23/2015, 6:05 PM  CC: Lewis Moccasin, MD

## 2015-07-25 ENCOUNTER — Ambulatory Visit
Admission: RE | Admit: 2015-07-25 | Discharge: 2015-07-25 | Disposition: A | Discharge status: Home / Self Care | Source: Ambulatory Visit | Attending: Physician Assistant | Admitting: Physician Assistant

## 2015-07-25 DIAGNOSIS — R1310 Dysphagia, unspecified: Secondary | ICD-10-CM

## 2015-08-29 ENCOUNTER — Telehealth: Payer: Self-pay | Admitting: *Deleted

## 2015-08-29 NOTE — Telephone Encounter (Signed)
LM for the patient to advise we moved her EGD Scheduled for  10-4 at 4:00 PM is now on 10-4-at 7:30 am. Asked patient to please call me back.

## 2015-08-30 NOTE — Telephone Encounter (Signed)
I called the patient and explained we had to change her EGD appointment on 09-19-2015 from 4:00 PM to 7:30 AM.   The patient was willing to do this and said that will be fine with her.  I advised she is to arrive at 7:00 am and have no food after midnight and nothing by mouth after 4:30 AM. Patient verbalized understanding the instructions.

## 2015-09-19 ENCOUNTER — Encounter: Admitting: Internal Medicine

## 2015-09-19 ENCOUNTER — Encounter: Payer: Self-pay | Admitting: Internal Medicine

## 2015-09-19 ENCOUNTER — Ambulatory Visit (AMBULATORY_SURGERY_CENTER): Admitting: Internal Medicine

## 2015-09-19 VITALS — BP 119/87 | HR 84 | Temp 97.0°F | Resp 81 | Ht 62.0 in | Wt 188.0 lb

## 2015-09-19 DIAGNOSIS — K219 Gastro-esophageal reflux disease without esophagitis: Secondary | ICD-10-CM

## 2015-09-19 DIAGNOSIS — R1310 Dysphagia, unspecified: Secondary | ICD-10-CM

## 2015-09-19 MED ORDER — SODIUM CHLORIDE 0.9 % IV SOLN: 500.0000 mL | INTRAVENOUS | Status: DC

## 2015-09-19 NOTE — Patient Instructions (Signed)

## 2015-09-19 NOTE — Op Note (Signed)
[facility] [address] [place], [address]   ENDOSCOPY PROCEDURE REPORT  PATIENT: Kelly, Cooper  MR#: [id number] BIRTHDATE: 10-02-1971 , 44  yrs. old GENDER: female ENDOSCOPIST: Roxy Cedar, MD REFERRED BY:  Maryelizabeth Rowan, M.D. PROCEDURE DATE:  09/19/2015 PROCEDURE:  EGD, diagnostic ASA CLASS:     Class I INDICATIONS:  dyspepsia, chest pain, and dysphagia. Since office visit doing better with improved appetite and weight gain. Barium esophagram with tablet was unremarkable. Still with belching and gas. MEDICATIONS: Monitored anesthesia care and Propofol 140 mg IV TOPICAL ANESTHETIC: none  DESCRIPTION OF PROCEDURE: After the risks benefits and alternatives of the procedure were thoroughly explained, informed consent was obtained.  The LB [id number] endoscope was introduced through the mouth and advanced to the second portion of the duodenum , Without limitations.  The instrument was slowly withdrawn as the mucosa was fully examined.     EXAM: The esophagus and gastroesophageal junction were completely normal in appearance.  The stomach was entered and closely examined.The antrum, angularis, and lesser curvature were well visualized, including a retroflexed view of the cardia and fundus. The stomach wall was normally distensable.  The scope passed easily through the pylorus into the duodenum.  Retroflexed views revealed no abnormalities.     The scope was then withdrawn from the patient and the procedure completed.  COMPLICATIONS: There were no immediate complications.  ENDOSCOPIC IMPRESSION: 1. Normal EGD  RECOMMENDATIONS: 1.  Anti-reflux regimen to be followed 2.  Continue Nexium 3. Make a routine office follow-up appointment with Dr. Marina Goodell in 3 months  REPEAT EXAM:  eSigned:  Roxy Cedar, MD 09/19/2015 7:57 AM    [id number] Duanne Guess, MD and The Patient

## 2015-09-19 NOTE — Progress Notes (Signed)
Transferred to recovery room. A/O x3, pleased with MAC.  VSS.  Report to Jane, RN. 

## 2015-09-20 ENCOUNTER — Telehealth: Payer: Self-pay | Admitting: *Deleted

## 2015-09-20 NOTE — Telephone Encounter (Signed)
  Follow up Call-  Call back number 09/19/2015  Post procedure Call Back phone  # [phone]  Permission to leave phone message Yes     Patient questions:  Do you have a fever, pain , or abdominal swelling? No. Pain Score  0 *  Have you tolerated food without any problems? Yes.    Have you been able to return to your normal activities? Yes.    Do you have any questions about your discharge instructions: Diet   No. Medications  No. Follow up visit  No.  Do you have questions or concerns about your Care? No.  Actions: * If pain score is 4 or above: No action needed, pain <4.

## 2015-11-23 ENCOUNTER — Other Ambulatory Visit: Payer: Self-pay | Admitting: Internal Medicine

## 2015-11-23 ENCOUNTER — Other Ambulatory Visit: Payer: Self-pay

## 2015-11-23 ENCOUNTER — Other Ambulatory Visit: Payer: Self-pay | Admitting: Family Medicine

## 2015-11-23 DIAGNOSIS — N63 Unspecified lump in unspecified breast: Secondary | ICD-10-CM

## 2015-11-23 DIAGNOSIS — E041 Nontoxic single thyroid nodule: Secondary | ICD-10-CM

## 2015-11-28 ENCOUNTER — Ambulatory Visit
Admission: RE | Admit: 2015-11-28 | Discharge: 2015-11-28 | Disposition: A | Discharge status: Home / Self Care | Source: Ambulatory Visit | Attending: Family Medicine | Admitting: Family Medicine

## 2015-11-28 DIAGNOSIS — N63 Unspecified lump in unspecified breast: Secondary | ICD-10-CM

## 2015-11-29 ENCOUNTER — Encounter: Payer: Self-pay | Admitting: Internal Medicine

## 2015-11-30 ENCOUNTER — Other Ambulatory Visit (HOSPITAL_COMMUNITY)
Admission: RE | Admit: 2015-11-30 | Discharge: 2015-11-30 | Disposition: A | Discharge status: Home / Self Care | Source: Ambulatory Visit | Attending: Radiology | Admitting: Radiology

## 2015-11-30 ENCOUNTER — Ambulatory Visit
Admission: RE | Admit: 2015-11-30 | Discharge: 2015-11-30 | Disposition: A | Discharge status: Home / Self Care | Source: Ambulatory Visit | Attending: Internal Medicine | Admitting: Internal Medicine

## 2015-11-30 DIAGNOSIS — E041 Nontoxic single thyroid nodule: Secondary | ICD-10-CM

## 2016-05-24 IMAGING — RF DG ESOPHAGUS
15 of 18 series · 19 of 24 positions shown · non-contrast
Comparison: None.

CLINICAL DATA: Difficulty swallowing.

EXAM:
ESOPHOGRAM / BARIUM SWALLOW / BARIUM TABLET STUDY
TECHNIQUE: Combined double contrast and single contrast examination performed
using effervescent crystals, thick barium liquid, and thin barium
liquid. The patient was observed with fluoroscopy swallowing a 13 mm
barium sulphate tablet.
FLUOROSCOPY TIME:  Radiation Exposure Index (as provided by the
fluoroscopic device): 13 dGy cm2
If the device does not provide the exposure index:
Fluoroscopy Time:  0 minutes 48 seconds.
Number of Acquired Images:  9.

[Series 1: run · 2 of 4 slices shown (1 of 15)]
[im 1/4]
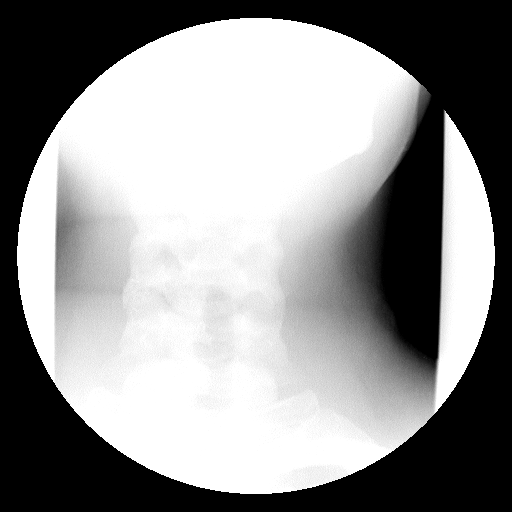
[im 2/4]
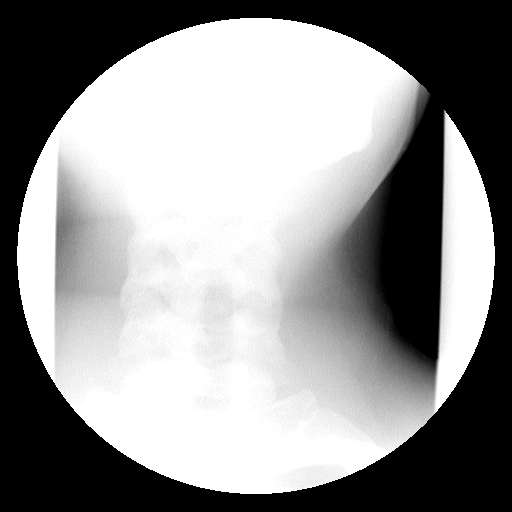

[Series 2: run · 1 of 1 slices shown (2 of 15)]
[im 1/1]
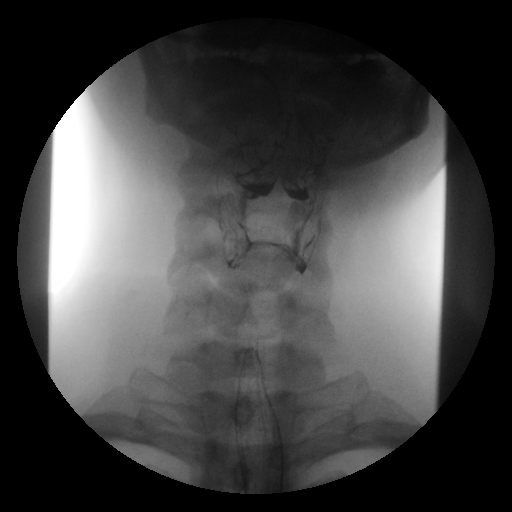

[Series 3: run · 4 of 5 slices shown (3 of 15)]
[im 1/5]
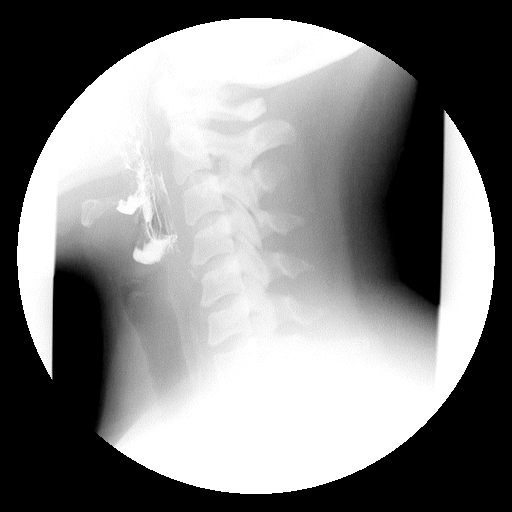
[im 2/5]
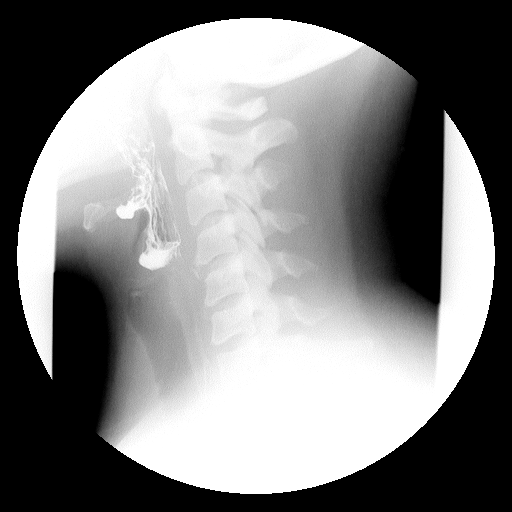
[im 3/5]
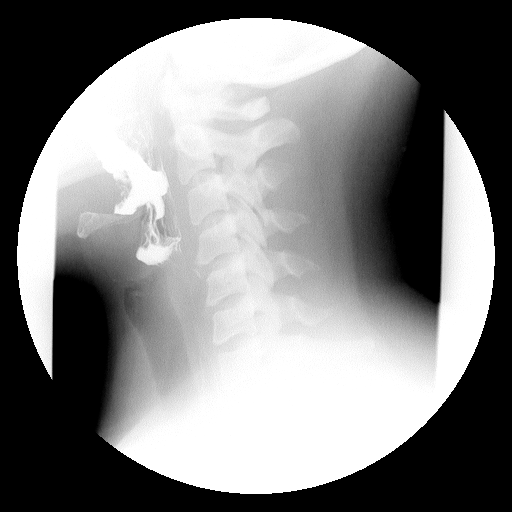
[im 5/5]
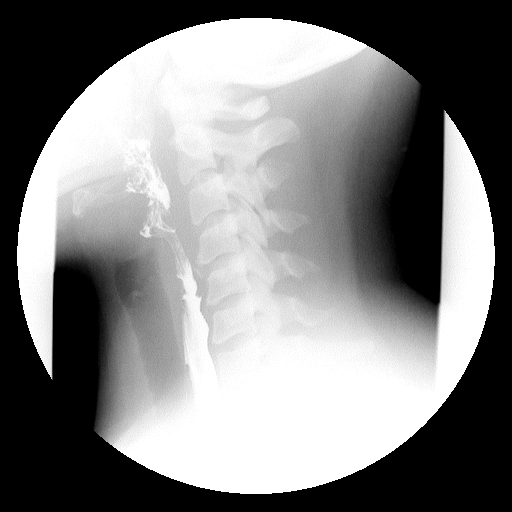

[Series 4: run · 1 of 1 slices shown (4 of 15)]
[im 1/1]
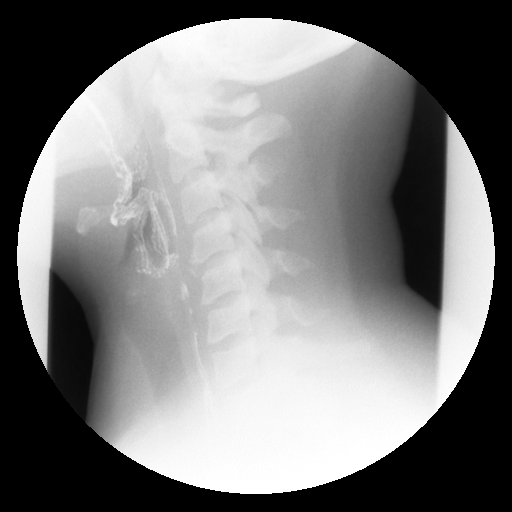

[Series 5: run · 1 of 1 slices shown (5 of 15)]
[im 1/1]
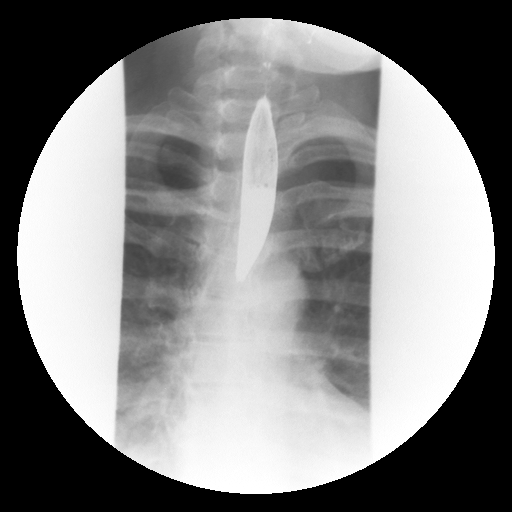

[Series 7: run · 1 of 1 slices shown (6 of 15)]
[im 1/1]
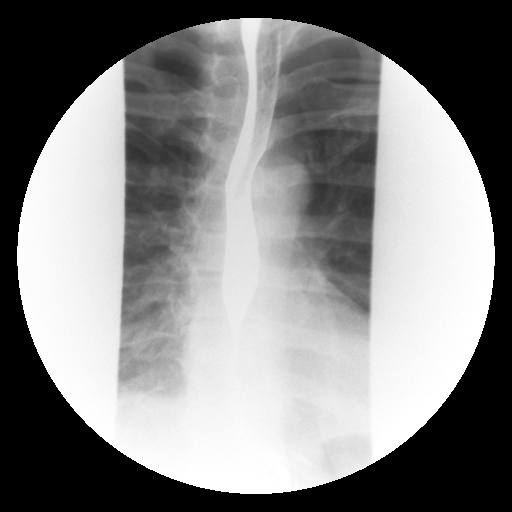

[Series 8: run · 1 of 1 slices shown (7 of 15)]
[im 1/1]
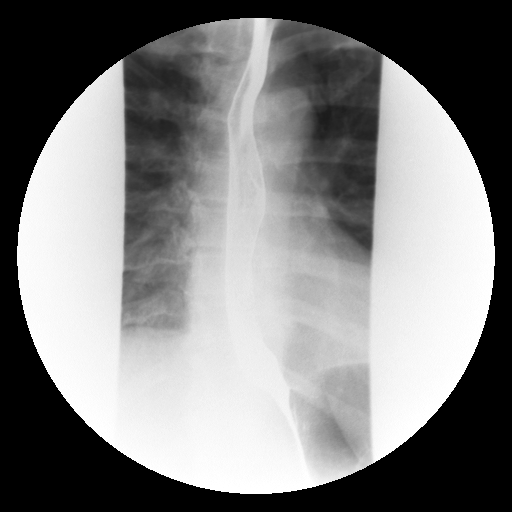

[Series 9: run · 1 of 1 slices shown (8 of 15)]
[im 1/1]
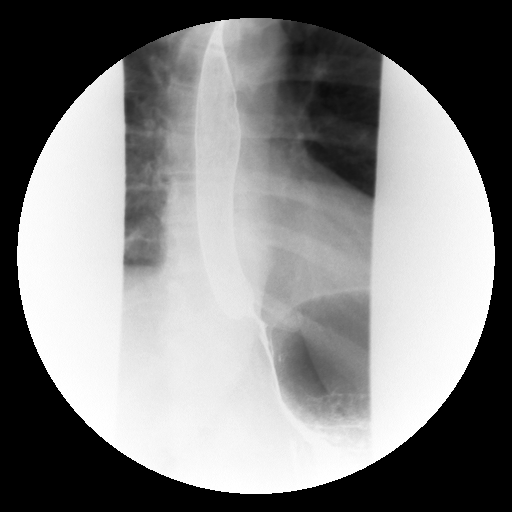

[Series 10: run · 1 of 1 slices shown (9 of 15)]
[im 1/1]
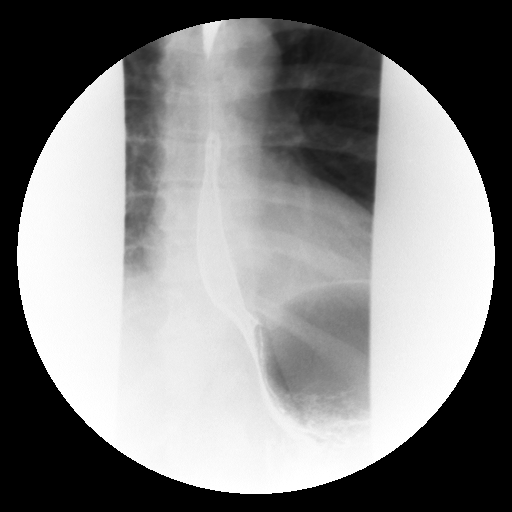

[Series 12: run · 1 of 1 slices shown (10 of 15)]
[im 1/1]
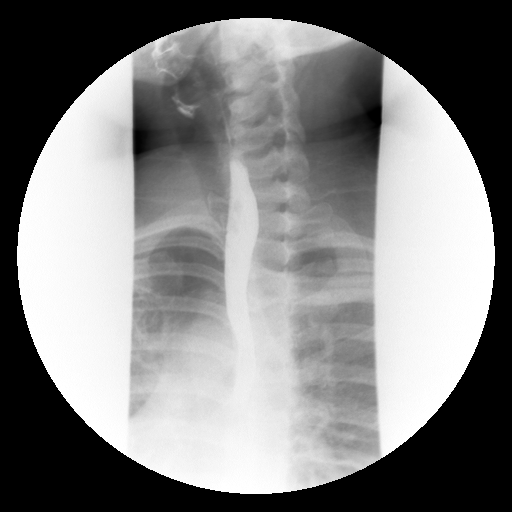

[Series 13: run · 1 of 1 slices shown (11 of 15)]
[im 1/1]
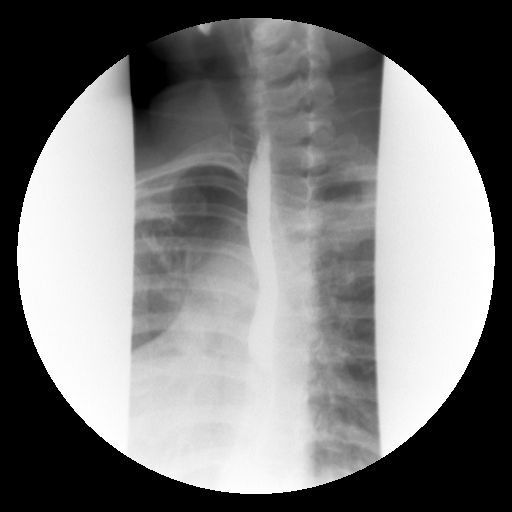

[Series 14: run · 1 of 1 slices shown (12 of 15)]
[im 1/1]
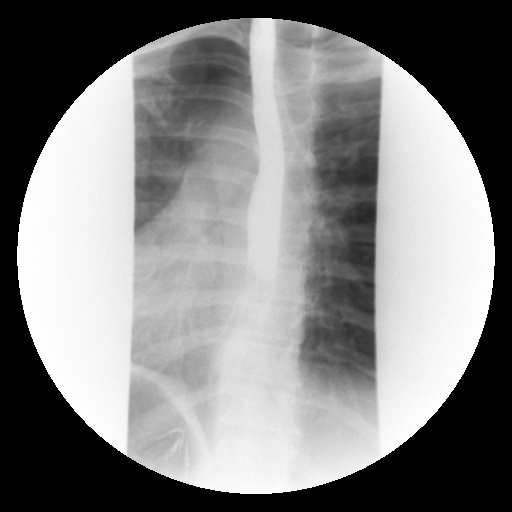

[Series 15: run · 1 of 1 slices shown (13 of 15)]
[im 1/1]
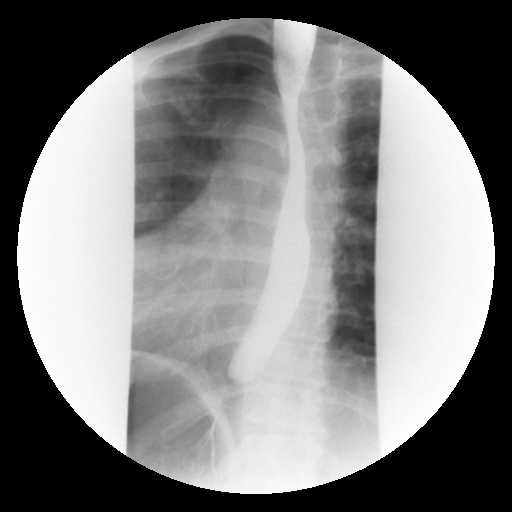

[Series 17: run · 1 of 1 slices shown (14 of 15)]
[im 1/1]
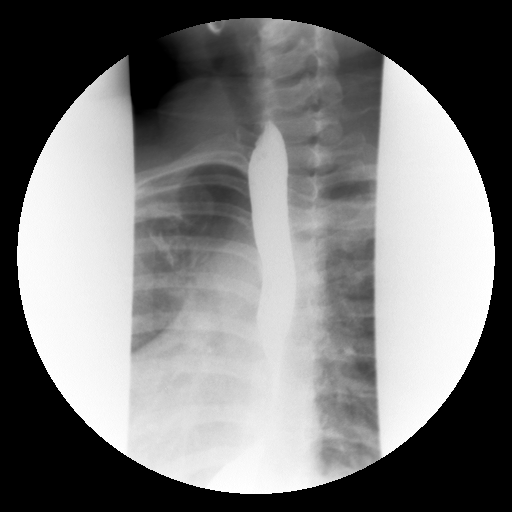

[Series 18: run · 1 of 1 slices shown (15 of 15)]
[im 1/1]
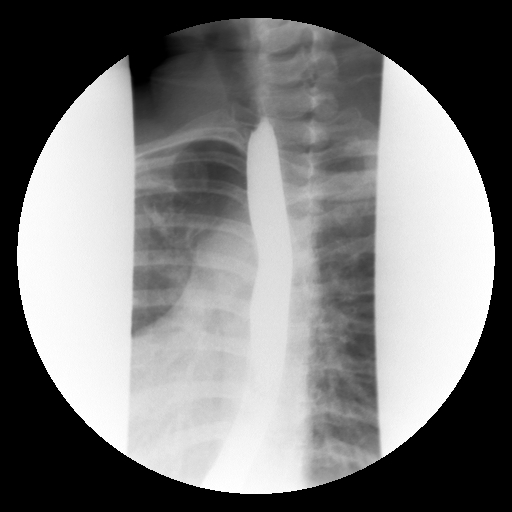

[19 of 24 positions shown; findings below may reference images not displayed]

FINDINGS: Swallowing mechanism is normal. No esophageal fold thickening,
stricture or obstruction. Normal esophageal motility. A 13 mm barium
tablet passed readily into the stomach.
IMPRESSION: Normal exam.

## 2017-12-25 ENCOUNTER — Encounter

## 2018-01-01 ENCOUNTER — Ambulatory Visit

## 2018-01-08 ENCOUNTER — Ambulatory Visit

## 2019-03-11 ENCOUNTER — Other Ambulatory Visit: Payer: Self-pay | Admitting: Internal Medicine

## 2019-03-11 DIAGNOSIS — Z1231 Encounter for screening mammogram for malignant neoplasm of breast: Secondary | ICD-10-CM

## 2019-03-25 ENCOUNTER — Other Ambulatory Visit: Payer: Self-pay | Admitting: Internal Medicine

## 2019-03-25 DIAGNOSIS — N6320 Unspecified lump in the left breast, unspecified quadrant: Secondary | ICD-10-CM

## 2019-05-03 ENCOUNTER — Encounter

## 2019-05-03 ENCOUNTER — Other Ambulatory Visit

## 2020-10-05 ENCOUNTER — Emergency Department (HOSPITAL_COMMUNITY)

## 2020-10-05 ENCOUNTER — Emergency Department (HOSPITAL_COMMUNITY)
Admission: EM | Admit: 2020-10-05 | Discharge: 2020-10-05 | Disposition: A | Discharge status: Home / Self Care | Attending: Emergency Medicine | Admitting: Emergency Medicine

## 2020-10-05 ENCOUNTER — Other Ambulatory Visit: Payer: Self-pay

## 2020-10-05 ENCOUNTER — Encounter (HOSPITAL_COMMUNITY): Payer: Self-pay | Admitting: *Deleted

## 2020-10-05 DIAGNOSIS — Z20822 Contact with and (suspected) exposure to covid-19: Secondary | ICD-10-CM | POA: Diagnosis not present

## 2020-10-05 DIAGNOSIS — R0602 Shortness of breath: Secondary | ICD-10-CM | POA: Diagnosis present

## 2020-10-05 DIAGNOSIS — J069 Acute upper respiratory infection, unspecified: Secondary | ICD-10-CM | POA: Diagnosis not present

## 2020-10-05 DIAGNOSIS — E119 Type 2 diabetes mellitus without complications: Secondary | ICD-10-CM | POA: Insufficient documentation

## 2020-10-05 DIAGNOSIS — I10 Essential (primary) hypertension: Secondary | ICD-10-CM | POA: Diagnosis not present

## 2020-10-05 HISTORY — DX: Type 2 diabetes mellitus without complications: E11.9

## 2020-10-05 LAB — RESPIRATORY PANEL BY RT PCR (FLU A&B, COVID)
Influenza A by PCR: NEGATIVE
Influenza B by PCR: NEGATIVE
SARS Coronavirus 2 by RT PCR: NEGATIVE

## 2020-10-05 MED ORDER — ALBUTEROL SULFATE HFA 108 (90 BASE) MCG/ACT IN AERS
2.0000 | INHALATION_SPRAY | Freq: Once | RESPIRATORY_TRACT | Status: AC
Start: 2020-10-05 — End: 2020-10-05
  Administered 2020-10-05: 2 via RESPIRATORY_TRACT
  Filled 2020-10-05: qty 6.7

## 2020-10-05 NOTE — ED Triage Notes (Addendum)
Patient presents to ed c/o sob onset 2 days ago , c/o chest congestion . States increased sob when lying down

## 2020-10-05 NOTE — Discharge Instructions (Addendum)
Today for shortness of breath.  This is likely a viral syndrome.  Use inhaler as needed.  Your Covid and flu tests are pending.  You may look in MyChart for these results.  Your chest x-ray did not show any obvious pneumonia.

## 2020-10-05 NOTE — ED Notes (Signed)
Pt discharge instructions reviewed with the patient. The patient verbalized understanding of instructions. Pt discharged. 

## 2020-10-05 NOTE — ED Provider Notes (Signed)
[facility] EMERGENCY DEPARTMENT Provider Note   CSN: [id number] Arrival date & time: 10/05/20  0518     History Chief Complaint  Patient presents with  . Shortness of Breath    Kelly Cooper is a 49 y.o. female.  HPI     This a 49 year old female with a history of diabetes, reflux, hypertension who presents with shortness of breath.  Patient reports over the last 2 to 3 days she has had worsening shortness of breath mostly at night.  She has had some upper respiratory congestion and a nonproductive cough.  No fevers.  She states that she has a grandchild who is sick.  She takes a daily allergy medication.  She is not had any chest pain or lower extremity swelling.  She is not a smoker and no history of asthma.  She is fully vaccinated against COVID-19.  Past Medical History:  Diagnosis Date  . Anemia   . Diabetes mellitus without complication (HCC)   . Diverticulosis of intestine 01/02/2015  . GERD (gastroesophageal reflux disease) 01/23/2015  . Hypertension     Patient Active Problem List   Diagnosis Date Noted  . Diverticulosis of colon without hemorrhage 07/23/2015  . Dysphagia 07/23/2015    Past Surgical History:  Procedure Laterality Date  . ABDOMINAL HYSTERECTOMY     partial   . TUBAL LIGATION    . WISDOM TOOTH EXTRACTION     one tooth removed     OB History   No obstetric history on file.     Family History  Problem Relation Age of Onset  . Diabetes Maternal Grandfather   . Colon cancer Maternal Grandfather   . Prostate cancer Maternal Grandfather   . Diabetes Paternal Aunt   . Diabetes Paternal Uncle   . Kidney disease Father   . Lung cancer Father   . Esophageal cancer Neg Hx   . Stomach cancer Neg Hx   . Rectal cancer Neg Hx     Social History   Tobacco Use  . Smoking status: Never Smoker  . Smokeless tobacco: Never Used  Substance Use Topics  . Alcohol use: Yes    Alcohol/week: 0.0 standard drinks    Comment: occ   . Drug use: No    Home Medications Prior to Admission medications   Medication Sig Start Date End Date Taking? Authorizing Provider  esomeprazole (NEXIUM) 40 MG capsule Take 1 capsule (40 mg total) by mouth daily at 12 noon. 30 minutes before breakfast 07/21/15   Hvozdovic, Lori P, PA-C    Allergies    Losartan  Review of Systems   Review of Systems  Constitutional: Negative for fever.  HENT: Positive for congestion.   Respiratory: Positive for shortness of breath.   Cardiovascular: Negative for chest pain and leg swelling.  Gastrointestinal: Negative for abdominal pain, nausea and vomiting.  Genitourinary: Negative for dysuria.  All other systems reviewed and are negative.   Physical Exam Updated Vital Signs BP 134/89   Pulse 89   Temp 98 F (36.7 C) (Oral)   Resp 18   Ht 1.549 m (5\' 1" )   Wt 104.3 kg   SpO2 99%   BMI 43.46 kg/m   Physical Exam Vitals and nursing note reviewed.  Constitutional:      Appearance: She is well-developed. She is obese. She is not ill-appearing.  HENT:     Head: Normocephalic and atraumatic.  Eyes:     Pupils: Pupils are equal, round, and reactive to  light.  Cardiovascular:     Rate and Rhythm: Normal rate and regular rhythm.     Heart sounds: Normal heart sounds.  Pulmonary:     Effort: Pulmonary effort is normal. No respiratory distress.     Breath sounds: No wheezing.     Comments: Fair air movement in all lung fields, occasional wheeze, no rhonchi Abdominal:     General: Bowel sounds are normal.     Palpations: Abdomen is soft.  Musculoskeletal:     Cervical back: Neck supple.     Right lower leg: No edema.     Left lower leg: No edema.  Skin:    General: Skin is warm and dry.  Neurological:     Mental Status: She is alert and oriented to person, place, and time.  Psychiatric:        Mood and Affect: Mood normal.     ED Results / Procedures / Treatments   Labs (all labs ordered are listed, but only abnormal results  are displayed) Labs Reviewed  RESPIRATORY PANEL BY RT PCR (FLU A&B, COVID)    EKG None  Radiology DG Chest 2 View  Result Date: 10/05/2020 CLINICAL DATA:  Congestion.  Shortness of breath for 2 days EXAM: CHEST - 2 VIEW COMPARISON:  None. FINDINGS: Normal heart size and mediastinal contours. No acute infiltrate or edema. No effusion or pneumothorax. No acute osseous findings. IMPRESSION: Negative chest. Electronically Signed   By: Marnee Spring M.D.   On: 10/05/2020 05:50    Procedures Procedures (including critical care time)  Medications Ordered in ED Medications  albuterol (VENTOLIN HFA) 108 (90 Base) MCG/ACT inhaler 2 puff (2 puffs Inhalation Given 10/05/20 4098)    ED Course  I have reviewed the triage vital signs and the nursing notes.  Pertinent labs & imaging results that were available during my care of the patient were reviewed by me and considered in my medical decision making (see chart for details).    MDM Rules/Calculators/A&P                           Patient presents with upper respiratory symptoms, shortness of breath, and cough.  She does have a sick contact.  She has been fully vaccinated against COVID-19.  O2 sats 100% on room air.  She is afebrile.  Chest x-ray without obvious pneumothorax or pneumonia.  She has fair air movement with an occasional wheeze but no history of asthma or smoking.  Question bronchitis.  Given prevalence of COVID-19, Covid and flu testing were obtained.  She was given an inhaler.  Suspect viral URI.  Do not feel she needs to wait for test results.  Will discharge with symptomatic treatment.  She may check MyChart for test results.  Do not feel she needs further work-up at this time.  No evidence of volume overload and no significant heart history.  She does not have any chest pain or lower extremity edema.  After history, exam, and medical workup I feel the patient has been appropriately medically screened and is safe for discharge  home. Pertinent diagnoses were discussed with the patient. Patient was given return precautions.   Final Clinical Impression(s) / ED Diagnoses Final diagnoses:  Viral URI with cough    Rx / DC Orders ED Discharge Orders    None       Effrey Davidow, Mayer Masker, MD 10/05/20 [phone]

## 2024-04-26 ENCOUNTER — Encounter (HOSPITAL_COMMUNITY): Payer: Self-pay

## 2024-05-03 ENCOUNTER — Ambulatory Visit (INDEPENDENT_AMBULATORY_CARE_PROVIDER_SITE_OTHER): Admitting: Family

## 2024-05-03 VITALS — BP 128/89 | HR 100 | Ht 62.0 in | Wt 223.0 lb

## 2024-05-03 DIAGNOSIS — E1165 Type 2 diabetes mellitus with hyperglycemia: Secondary | ICD-10-CM

## 2024-05-03 DIAGNOSIS — I10 Essential (primary) hypertension: Secondary | ICD-10-CM | POA: Diagnosis not present

## 2024-05-03 DIAGNOSIS — Z7689 Persons encountering health services in other specified circumstances: Secondary | ICD-10-CM

## 2024-05-03 DIAGNOSIS — E119 Type 2 diabetes mellitus without complications: Secondary | ICD-10-CM

## 2024-05-03 DIAGNOSIS — E669 Obesity, unspecified: Secondary | ICD-10-CM | POA: Diagnosis not present

## 2024-05-03 DIAGNOSIS — Z7984 Long term (current) use of oral hypoglycemic drugs: Secondary | ICD-10-CM

## 2024-05-03 DIAGNOSIS — Z6841 Body Mass Index (BMI) 40.0 and over, adult: Secondary | ICD-10-CM

## 2024-05-03 MED ORDER — PHENTERMINE HCL 15 MG PO CAPS: 15.0000 mg | ORAL_CAPSULE | ORAL | 0 refills | Status: DC

## 2024-05-03 MED ORDER — AMLODIPINE BESYLATE 5 MG PO TABS: 5.0000 mg | ORAL_TABLET | Freq: Every day | ORAL | 0 refills | Status: DC

## 2024-05-03 MED ORDER — GLIMEPIRIDE 2 MG PO TABS: 2.0000 mg | ORAL_TABLET | Freq: Every day | ORAL | 0 refills | Status: DC

## 2024-05-03 NOTE — Progress Notes (Signed)
 Subjective:    Kelly Cooper - 53 y.o. female MRN [id number]  Date of birth: [birth year]  HPI  Kelly Cooper is to establish care.   Current issues and/or concerns: - High blood pressure. Doing well on Amlodipine , no issues/concerns. She does not complain of red flag symptoms such as but not limited to chest pain, shortness of breath, worst headache of life, nausea/vomiting.  - Type 2 diabetes. Doing well on Glimepiride , no issues/concerns. She denies red flag symptoms associated with diabetes. States she has not taken Jardiance in 1 month due to her health insurance no longer covers cost. States her health insurance does not cover cost of Ozempic. Reports she is intolerant to Metformin. - Due for diabetic eye exam. - Due for diabetic foot exam. - States she would like to begin Phentermine . She is trying to watch what she eats. She does not exercise.  - No further issues/concerns for discussion today.   ROS per HPI    Health Maintenance:  Health Maintenance Due  Topic Date Due   HIV Screening  Never done   Hepatitis C Screening  Never done   DTaP/Tdap/Td (1 - Tdap) Never done   Cervical Cancer Screening (HPV/Pap Cotest)  Never done   Colonoscopy  Never done   MAMMOGRAM  01/21/2020   Zoster Vaccines- Shingrix (1 of 2) Never done   COVID-19 Vaccine (1 - 2024-25 season) Never done     Past Medical History: Patient Active Problem List   Diagnosis Date Noted   Diverticulosis of colon without hemorrhage 07/23/2015   Dysphagia 07/23/2015      Social History   reports that she has never smoked. She has never used smokeless tobacco. She reports current alcohol use. She reports that she does not use drugs.   Family History  family history includes Colon cancer in her maternal grandfather; Diabetes in her maternal grandfather, paternal aunt, and paternal uncle; Kidney disease in her father; Lung cancer in her father; Prostate cancer in her maternal grandfather.   Medications:  reviewed and updated   Objective:   Physical Exam BP 128/89   Pulse 100   Ht 5\' 2"  (1.575 m)   Wt 223 lb (101.2 kg)   SpO2 96%   BMI 40.79 kg/m   Physical Exam HENT:     Head: Normocephalic and atraumatic.     Nose: Nose normal.     Mouth/Throat:     Mouth: Mucous membranes are moist.     Pharynx: Oropharynx is clear.  Eyes:     Extraocular Movements: Extraocular movements intact.     Conjunctiva/sclera: Conjunctivae normal.     Pupils: Pupils are equal, round, and reactive to light.  Cardiovascular:     Rate and Rhythm: Normal rate and regular rhythm.     Pulses: Normal pulses.     Heart sounds: Normal heart sounds.  Pulmonary:     Effort: Pulmonary effort is normal.     Breath sounds: Normal breath sounds.  Musculoskeletal:        General: Normal range of motion.     Cervical back: Normal range of motion and neck supple.  Neurological:     General: No focal deficit present.     Mental Status: She is alert and oriented to person, place, and time.  Psychiatric:        Mood and Affect: Mood normal.        Behavior: Behavior normal.        Assessment & Plan:  1. Encounter  to establish care (Primary) - Patient presents today to establish care. During the interim follow-up with primary provider as scheduled.  - Return for annual physical examination, labs, and health maintenance. Arrive fasting meaning having no food for at least 8 hours prior to appointment. You may have only water or black coffee. Please take scheduled medications as normal.  2. Primary hypertension - Continue Amlodipine  as prescribed.  - Routine screening.  - Counseled on blood pressure goal of less than 130/80, low-sodium, DASH diet, medication compliance, and 150 minutes of moderate intensity exercise per week as tolerated. Counseled on medication adherence and adverse effects. - Follow-up with primary provider in 3 months or sooner if needed.  - Basic Metabolic Panel - amLODipine  (NORVASC ) 5 MG  tablet; Take 1 tablet (5 mg total) by mouth daily.  Dispense: 90 tablet; Refill: 0  3. Type 2 diabetes mellitus with hyperglycemia, without long-term current use of insulin (HCC) - Continue Glimepiride  as prescribed.  - Hemoglobin A1c result pending. - Routine screening. - Discussed the importance of healthy eating habits, low-carbohydrate diet, low-sugar diet, regular aerobic exercise (at least 150 minutes a week as tolerated) and medication compliance to achieve or maintain control of diabetes. Counseled on medication adherence/adverse effects.  - Follow-up with primary provider as scheduled. - Microalbumin / creatinine urine ratio - Hemoglobin A1c - glimepiride  (AMARYL ) 2 MG tablet; Take 1 tablet (2 mg total) by mouth daily.  Dispense: 90 tablet; Refill: 0  4. Diabetic eye exam [facility]) - Referral to Ophthalmology for evaluation/management. - Ambulatory referral to Ophthalmology  5. Encounter for diabetic foot exam [facility]) - Referral to Podiatry for evaluation/management. - Ambulatory referral to Podiatry  6. Encounter for weight management 7. BMI 40.0-44.9, adult (HCC) - Phentermine  as prescribed. Counseled on medication adherence and adverse effects.  - I did check the [place]  prescription drug database . - Counseled on low-sodium DASH diet and 150 minutes of moderate intensity exercise per week as tolerated to assist with weight management.  - Follow-up with primary provider in 4 weeks or sooner if needed. - phentermine  15 MG capsule; Take 1 capsule (15 mg total) by mouth every morning.  Dispense: 30 capsule; Refill: 0    Patient was given clear instructions to go to Emergency Department or return to medical center if symptoms don't improve, worsen, or new problems develop.The patient verbalized understanding.  I discussed the assessment and treatment plan with the patient. The patient was provided an opportunity to ask questions and all were answered. The patient agreed with  the plan and demonstrated an understanding of the instructions.   The patient was advised to call back or seek an in-person evaluation if the symptoms worsen or if the condition fails to improve as anticipated.    Lavona Pounds, NP 05/03/2024, 1:32 PM Primary Care at [facility]

## 2024-05-03 NOTE — Patient Instructions (Signed)

## 2024-05-04 ENCOUNTER — Ambulatory Visit: Payer: Self-pay | Admitting: Family

## 2024-05-04 DIAGNOSIS — E1165 Type 2 diabetes mellitus with hyperglycemia: Secondary | ICD-10-CM

## 2024-05-04 LAB — BASIC METABOLIC PANEL WITH GFR
BUN/Creatinine Ratio: 15 (ref 9–23)
BUN: 9 mg/dL (ref 6–24)
CO2: 21 mmol/L (ref 20–29)
Calcium: 9.5 mg/dL (ref 8.7–10.2)
Chloride: 102 mmol/L (ref 96–106)
Creatinine, Ser: 0.62 mg/dL (ref 0.57–1.00)
Glucose: 100 mg/dL — ABNORMAL HIGH (ref 70–99)
Potassium: 4.2 mmol/L (ref 3.5–5.2)
Sodium: 140 mmol/L (ref 134–144)
eGFR: 106 mL/min/{1.73_m2} (ref >59)

## 2024-05-04 LAB — HEMOGLOBIN A1C
Est. average glucose Bld gHb Est-mCnc: 157 mg/dL
Hgb A1c MFr Bld: 7.1 % — ABNORMAL HIGH (ref 4.8–5.6)

## 2024-05-04 LAB — MICROALBUMIN / CREATININE URINE RATIO
Creatinine, Urine: 167.9 mg/dL
Microalb/Creat Ratio: 15 mg/g{creat} (ref 0–29)
Microalbumin, Urine: 25.3 ug/mL

## 2024-05-04 MED ORDER — GLIMEPIRIDE 4 MG PO TABS: 4.0000 mg | ORAL_TABLET | Freq: Every day | ORAL | 0 refills | Status: DC

## 2024-05-05 ENCOUNTER — Other Ambulatory Visit: Payer: Self-pay | Admitting: Family

## 2024-05-05 DIAGNOSIS — Z6841 Body Mass Index (BMI) 40.0 and over, adult: Secondary | ICD-10-CM

## 2024-05-05 DIAGNOSIS — Z7689 Persons encountering health services in other specified circumstances: Secondary | ICD-10-CM

## 2024-05-12 ENCOUNTER — Encounter: Payer: Self-pay | Admitting: Podiatry

## 2024-05-12 ENCOUNTER — Ambulatory Visit (INDEPENDENT_AMBULATORY_CARE_PROVIDER_SITE_OTHER): Admitting: Podiatry

## 2024-05-12 DIAGNOSIS — E1140 Type 2 diabetes mellitus with diabetic neuropathy, unspecified: Secondary | ICD-10-CM

## 2024-05-12 DIAGNOSIS — M79671 Pain in right foot: Secondary | ICD-10-CM | POA: Diagnosis not present

## 2024-05-12 DIAGNOSIS — M79672 Pain in left foot: Secondary | ICD-10-CM | POA: Diagnosis not present

## 2024-05-12 MED ORDER — GABAPENTIN 100 MG PO CAPS: 100.0000 mg | ORAL_CAPSULE | Freq: Three times a day (TID) | ORAL | 3 refills | Status: AC

## 2024-05-12 NOTE — Progress Notes (Signed)
 Patient presents with complaint of tingling in her toes bilaterally.  She is a type II diabetic.  Her hemoglobin A1c was 7.1.  She does state that the tingling for about a year now.  Occasionally has some aches and pains in her feet which are more intermittent.  Does not smoke   Physical exam:  General appearance: Pleasant, and in no acute distress. AOx3.  Vascular: Pedal pulses: DP palpable bilaterally, PT palpable bilaterally.  Mild edema lower legs bilaterally. Capillary fill time immediate.  Neurological: Light touch intact feet bilaterally.  Normal Achilles reflex bilaterally.  No clonus or spasticity noted.  Slightly decreased vibratory sensation in feet bilaterally.  Normal Semmes Weinstein monofilament sensation   Dermatologic:   Skin normal temperature bilaterally.  Skin normal color, tone, and texture bilaterally.  No signs of any preulcerative areas.  Musculoskeletal: Mild hammertoes 2 through 5 bilaterally.  Normal muscle strength foot and ankle.  Normal range of motion.  Radiographs: None  Diagnosis: 1.  Pain feet bilaterally. 2.  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 - Discussed diabetic neuropathy and etiology and treatment.  She intermittently takes gabapentin from another doctor.  Will have her start taking gabapentin 100 mg 1 p.o. right ear regularly on a daily basis.  Discussed precautions she needs to take with neuropathy to guard against injuries to the foot.  Discussed with her self-care looking for signs of ulceration or infection. - Discussed proper shoes. - Rx gabapentin 100 mg p.o. 3 times daily x 3 refills   Return 3 months follow-up diabetic neuropathy

## 2024-05-31 ENCOUNTER — Ambulatory Visit (INDEPENDENT_AMBULATORY_CARE_PROVIDER_SITE_OTHER): Admitting: Family

## 2024-05-31 ENCOUNTER — Encounter: Payer: Self-pay | Admitting: Family

## 2024-05-31 ENCOUNTER — Ambulatory Visit: Payer: Self-pay | Admitting: Family

## 2024-05-31 VITALS — BP 110/73 | HR 97 | Temp 98.3°F | Resp 16 | Ht 62.0 in | Wt 220.4 lb

## 2024-05-31 DIAGNOSIS — E1165 Type 2 diabetes mellitus with hyperglycemia: Secondary | ICD-10-CM | POA: Diagnosis not present

## 2024-05-31 DIAGNOSIS — Z7984 Long term (current) use of oral hypoglycemic drugs: Secondary | ICD-10-CM | POA: Diagnosis not present

## 2024-05-31 DIAGNOSIS — Z7689 Persons encountering health services in other specified circumstances: Secondary | ICD-10-CM

## 2024-05-31 DIAGNOSIS — Z0100 Encounter for examination of eyes and vision without abnormal findings: Secondary | ICD-10-CM

## 2024-05-31 DIAGNOSIS — Z6841 Body Mass Index (BMI) 40.0 and over, adult: Secondary | ICD-10-CM | POA: Diagnosis not present

## 2024-05-31 DIAGNOSIS — E669 Obesity, unspecified: Secondary | ICD-10-CM

## 2024-05-31 LAB — POCT GLYCOSYLATED HEMOGLOBIN (HGB A1C): Hemoglobin A1C: 7.2 % — AB (ref 4.0–5.6)

## 2024-05-31 MED ORDER — GLIMEPIRIDE 4 MG PO TABS: 8.0000 mg | ORAL_TABLET | Freq: Every day | ORAL | 0 refills | Status: DC

## 2024-05-31 MED ORDER — PHENTERMINE HCL 30 MG PO CAPS: 30.0000 mg | ORAL_CAPSULE | ORAL | 0 refills | Status: DC

## 2024-05-31 NOTE — Progress Notes (Signed)
 4 week follow up

## 2024-05-31 NOTE — Progress Notes (Signed)
 Patient ID: Kelly Cooper, female    DOB: November 08, 1971  MRN: [id number]  CC: Chronic Conditions Follow-Up  Subjective: Kelly Cooper is a 53 y.o. female who presents for chronic conditions follow-up.   Her concerns today include:  - Doing well on Glimepiride , no issues/concerns. She denies red flag symptoms associated with diabetes.  - Needs new referral to Ophthalmology due to appointment unavailable until February 2026. - Established with Podiatry. - Doing well on Phentermine , no issues/concerns.  Patient Active Problem List   Diagnosis Date Noted   Diverticulosis of colon without hemorrhage 07/23/2015   Dysphagia 07/23/2015     Current Outpatient Medications on File Prior to Visit  Medication Sig Dispense Refill   amLODipine  (NORVASC ) 5 MG tablet Take 1 tablet (5 mg total) by mouth daily. 90 tablet 0   esomeprazole (NEXIUM) 40 MG capsule Take 1 capsule (40 mg total) by mouth daily at 12 noon. 30 minutes before breakfast 30 capsule 3   gabapentin  (NEURONTIN ) 100 MG capsule Take 1 capsule (100 mg total) by mouth 3 (three) times daily. (Patient taking differently: Take 100 mg by mouth 3 (three) times daily. Patient taking as needed) 90 capsule 3   gabapentin  (NEURONTIN ) 300 MG capsule Take 300 mg by mouth daily.     glimepiride  (AMARYL ) 4 MG tablet Take 1 tablet (4 mg total) by mouth daily with breakfast. 90 tablet 0   glimepiride  (AMARYL ) 2 MG tablet Take 1 tablet (2 mg total) by mouth daily. (Patient not taking: Reported on 05/31/2024) 90 tablet 0   JARDIANCE 25 MG TABS tablet Take 25 mg by mouth daily. (Patient not taking: Reported on 05/12/2024)     No current facility-administered medications on file prior to visit.    Allergies  Allergen Reactions   Losartan Cough    Hoarseness and coughing and weight loss    Social History   Socioeconomic History   Marital status: Single    Spouse name: Not on file   Number of children: 3   Years of education: Not on file    Highest education level: 12th grade  Occupational History   Occupation: Surveyor, minerals  Tobacco Use   Smoking status: Never   Smokeless tobacco: Never  Substance and Sexual Activity   Alcohol use: Yes    Alcohol/week: 0.0 standard drinks of alcohol    Comment: occ   Drug use: No   Sexual activity: Not on file  Other Topics Concern   Not on file  Social History Narrative   Not on file   Social Drivers of Health   Financial Resource Strain: Medium Risk (05/03/2024)   Overall Financial Resource Strain (CARDIA)    Difficulty of Paying Living Expenses: Somewhat hard  Food Insecurity: Food Insecurity Present (05/03/2024)   Hunger Vital Sign    Worried About Running Out of Food in the Last Year: Often true    Ran Out of Food in the Last Year: Often true  Transportation Needs: No Transportation Needs (05/03/2024)   PRAPARE - Administrator, Civil Service (Medical): No    Lack of Transportation (Non-Medical): No  Physical Activity: Unknown (05/03/2024)   Exercise Vital Sign    Days of Exercise per Week: 0 days    Minutes of Exercise per Session: Not on file  Stress: Stress Concern Present (05/03/2024)   [employer] of Occupational Health - Occupational Stress Questionnaire    Feeling of Stress : Rather much  Social Connections: Unknown (05/03/2024)  Social Connection and Isolation Panel    Frequency of Communication with Friends and Family: More than three times a week    Frequency of Social Gatherings with Friends and Family: Twice a week    Attends Religious Services: Patient declined    Database administrator or Organizations: No    Attends Engineer, structural: Not on file    Marital Status: Patient declined  Intimate Partner Violence: Not At Risk (05/31/2024)   Humiliation, Afraid, Rape, and Kick questionnaire    Fear of Current or Ex-Partner: No    Emotionally Abused: No    Physically Abused: No    Sexually Abused: No    Family History   Problem Relation Age of Onset   Diabetes Maternal Grandfather    Colon cancer Maternal Grandfather    Prostate cancer Maternal Grandfather    Diabetes Paternal Aunt    Diabetes Paternal Uncle    Kidney disease Father    Lung cancer Father    Esophageal cancer Neg Hx    Stomach cancer Neg Hx    Rectal cancer Neg Hx     Past Surgical History:  Procedure Laterality Date   ABDOMINAL HYSTERECTOMY     partial    TUBAL LIGATION     WISDOM TOOTH EXTRACTION     one tooth removed    ROS: Review of Systems Negative except as stated above  PHYSICAL EXAM: BP 110/73   Pulse 97   Temp 98.3 F (36.8 C) (Oral)   Resp 16   Ht 5' 2 (1.575 m)   Wt 220 lb 6.4 oz (100 kg)   SpO2 98%   BMI 40.31 kg/m   Wt Readings from Last 3 Encounters:  05/31/24 220 lb 6.4 oz (100 kg)  05/03/24 223 lb (101.2 kg)  10/05/20 230 lb (104.3 kg)   Physical Exam HENT:     Head: Normocephalic and atraumatic.     Nose: Nose normal.     Mouth/Throat:     Mouth: Mucous membranes are moist.     Pharynx: Oropharynx is clear.   Eyes:     Extraocular Movements: Extraocular movements intact.     Conjunctiva/sclera: Conjunctivae normal.     Pupils: Pupils are equal, round, and reactive to light.    Cardiovascular:     Rate and Rhythm: Normal rate and regular rhythm.     Pulses: Normal pulses.     Heart sounds: Normal heart sounds.  Pulmonary:     Effort: Pulmonary effort is normal.     Breath sounds: Normal breath sounds.   Musculoskeletal:        General: Normal range of motion.     Cervical back: Normal range of motion and neck supple.   Neurological:     General: No focal deficit present.     Mental Status: She is alert and oriented to person, place, and time.   Psychiatric:        Mood and Affect: Mood normal.        Behavior: Behavior normal.     ASSESSMENT AND PLAN: 1. Type 2 diabetes mellitus with hyperglycemia, without long-term current use of insulin (HCC) (Primary) - Continue  Glimepiride  as prescribed. No refills needed as of present.  - Hemoglobin A1c result pending.  - Discussed the importance of healthy eating habits, low-carbohydrate diet, low-sugar diet, regular aerobic exercise (at least 150 minutes a week as tolerated) and medication compliance to achieve or maintain control of diabetes. Counseled on medication adherence/adverse  effects.  - Follow-up with primary provider as scheduled. - POCT glycosylated hemoglobin (Hb A1C); Future  2. Diabetic eye exam [facility]) - Referral to Ophthalmology for evaluation/management. - Ambulatory referral to Ophthalmology  3. Encounter for weight management 4. BMI 40.0-44.9, adult [facility]) - Patient lost 3 pounds since previous office visit.  - Increase Phentermine  from 15 mg to 30 mg as prescribed. Counseled on medication adherence/adverse effects.  - Follow-up with primary provider in 4 weeks or sooner if needed.  - phentermine  30 MG capsule; Take 1 capsule (30 mg total) by mouth every morning.  Dispense: 30 capsule; Refill: 0   Patient was given the opportunity to ask questions.  Patient verbalized understanding of the plan and was able to repeat key elements of the plan. Patient was given clear instructions to go to Emergency Department or return to medical center if symptoms don't improve, worsen, or new problems develop.The patient verbalized understanding.   Orders Placed This Encounter  Procedures   Ambulatory referral to Ophthalmology   POCT glycosylated hemoglobin (Hb A1C)     Requested Prescriptions   Signed Prescriptions Disp Refills   phentermine  30 MG capsule 30 capsule 0    Sig: Take 1 capsule (30 mg total) by mouth every morning.    Return in about 4 weeks (around 06/28/2024) for Follow-Up or next available weight check.  Senaida Dama, NP

## 2024-06-28 ENCOUNTER — Ambulatory Visit: Admitting: Family

## 2024-06-28 ENCOUNTER — Encounter: Payer: Self-pay | Admitting: Family

## 2024-06-28 ENCOUNTER — Ambulatory Visit: Payer: Self-pay | Admitting: Family

## 2024-06-28 VITALS — BP 116/82 | HR 99 | Temp 98.4°F | Resp 16 | Ht 62.0 in | Wt 216.8 lb

## 2024-06-28 DIAGNOSIS — Z6839 Body mass index (BMI) 39.0-39.9, adult: Secondary | ICD-10-CM | POA: Diagnosis not present

## 2024-06-28 DIAGNOSIS — Z7984 Long term (current) use of oral hypoglycemic drugs: Secondary | ICD-10-CM | POA: Diagnosis not present

## 2024-06-28 DIAGNOSIS — E669 Obesity, unspecified: Secondary | ICD-10-CM | POA: Diagnosis not present

## 2024-06-28 DIAGNOSIS — K146 Glossodynia: Secondary | ICD-10-CM

## 2024-06-28 DIAGNOSIS — Z6841 Body Mass Index (BMI) 40.0 and over, adult: Secondary | ICD-10-CM

## 2024-06-28 DIAGNOSIS — Z7689 Persons encountering health services in other specified circumstances: Secondary | ICD-10-CM

## 2024-06-28 DIAGNOSIS — E1165 Type 2 diabetes mellitus with hyperglycemia: Secondary | ICD-10-CM | POA: Diagnosis not present

## 2024-06-28 LAB — POCT GLYCOSYLATED HEMOGLOBIN (HGB A1C): Hemoglobin A1C: 7 % — AB (ref 4.0–5.6)

## 2024-06-28 MED ORDER — AMOXICILLIN-POT CLAVULANATE 875-125 MG PO TABS: 1.0000 | ORAL_TABLET | Freq: Two times a day (BID) | ORAL | 0 refills | Status: DC

## 2024-06-28 MED ORDER — PHENTERMINE HCL 37.5 MG PO CAPS: 37.5000 mg | ORAL_CAPSULE | ORAL | 0 refills | Status: DC

## 2024-06-28 MED ORDER — GLIMEPIRIDE 4 MG PO TABS: 8.0000 mg | ORAL_TABLET | Freq: Every day | ORAL | 0 refills | Status: DC

## 2024-06-28 NOTE — Progress Notes (Signed)
 Patient ID: Kelly Cooper, female    DOB: 1971-04-21  MRN: [id number]  CC: Chronic Conditions Follow-Up  Subjective: Kelly Cooper is a 53 y.o. female who presents for chronic conditions follow-up.   Her concerns today include:  - Doing well on Glimepiride , on issues/concerns. Denies red flag symptoms associated with diabetes.  - Doing well on Phentermine , no issues/concerns. - States sore/dry tongue. Denies red flag symptoms. She has tried over-the-counter medication with minimal relief.   Patient Active Problem List   Diagnosis Date Noted   Diverticulosis of colon without hemorrhage 07/23/2015   Dysphagia 07/23/2015     Current Outpatient Medications on File Prior to Visit  Medication Sig Dispense Refill   amLODipine  (NORVASC ) 5 MG tablet Take 1 tablet (5 mg total) by mouth daily. 90 tablet 0   esomeprazole (NEXIUM) 40 MG capsule Take 1 capsule (40 mg total) by mouth daily at 12 noon. 30 minutes before breakfast 30 capsule 3   gabapentin  (NEURONTIN ) 100 MG capsule Take 1 capsule (100 mg total) by mouth 3 (three) times daily. (Patient taking differently: Take 100 mg by mouth 3 (three) times daily. Patient taking as needed) 90 capsule 3   gabapentin  (NEURONTIN ) 300 MG capsule Take 300 mg by mouth daily.     glimepiride  (AMARYL ) 4 MG tablet Take 1 tablet (4 mg total) by mouth daily with breakfast. 90 tablet 0   glimepiride  (AMARYL ) 2 MG tablet Take 1 tablet (2 mg total) by mouth daily. (Patient not taking: Reported on 05/31/2024) 90 tablet 0   JARDIANCE 25 MG TABS tablet Take 25 mg by mouth daily. (Patient not taking: Reported on 05/12/2024)     No current facility-administered medications on file prior to visit.    Allergies  Allergen Reactions   Losartan Cough    Hoarseness and coughing and weight loss    Social History   Socioeconomic History   Marital status: Single    Spouse name: Not on file   Number of children: 3   Years of education: Not on file   Highest  education level: 12th grade  Occupational History   Occupation: Surveyor, minerals  Tobacco Use   Smoking status: Never   Smokeless tobacco: Never  Substance and Sexual Activity   Alcohol use: Yes    Alcohol/week: 0.0 standard drinks of alcohol    Comment: occ   Drug use: No   Sexual activity: Not on file  Other Topics Concern   Not on file  Social History Narrative   Not on file   Social Drivers of Health   Financial Resource Strain: Medium Risk (05/03/2024)   Overall Financial Resource Strain (CARDIA)    Difficulty of Paying Living Expenses: Somewhat hard  Food Insecurity: Food Insecurity Present (05/03/2024)   Hunger Vital Sign    Worried About Running Out of Food in the Last Year: Often true    Ran Out of Food in the Last Year: Often true  Transportation Needs: No Transportation Needs (05/03/2024)   PRAPARE - Administrator, Civil Service (Medical): No    Lack of Transportation (Non-Medical): No  Physical Activity: Unknown (05/03/2024)   Exercise Vital Sign    Days of Exercise per Week: 0 days    Minutes of Exercise per Session: Not on file  Stress: Stress Concern Present (05/03/2024)   [employer] of Occupational Health - Occupational Stress Questionnaire    Feeling of Stress : Rather much  Social Connections: Unknown (05/03/2024)   Social  Connection and Isolation Panel    Frequency of Communication with Friends and Family: More than three times a week    Frequency of Social Gatherings with Friends and Family: Twice a week    Attends Religious Services: Patient declined    Database administrator or Organizations: No    Attends Engineer, structural: Not on file    Marital Status: Patient declined  Intimate Partner Violence: Not At Risk (05/31/2024)   Humiliation, Afraid, Rape, and Kick questionnaire    Fear of Current or Ex-Partner: No    Emotionally Abused: No    Physically Abused: No    Sexually Abused: No    Family History   Problem Relation Age of Onset   Diabetes Maternal Grandfather    Colon cancer Maternal Grandfather    Prostate cancer Maternal Grandfather    Diabetes Paternal Aunt    Diabetes Paternal Uncle    Kidney disease Father    Lung cancer Father    Esophageal cancer Neg Hx    Stomach cancer Neg Hx    Rectal cancer Neg Hx     Past Surgical History:  Procedure Laterality Date   ABDOMINAL HYSTERECTOMY     partial    TUBAL LIGATION     WISDOM TOOTH EXTRACTION     one tooth removed    ROS: Review of Systems Negative except as stated above  PHYSICAL EXAM: BP 116/82   Pulse 99   Temp 98.4 F (36.9 C) (Oral)   Resp 16   Ht 5' 2 (1.575 m)   Wt 216 lb 12.8 oz (98.3 kg)   SpO2 98%   BMI 39.65 kg/m   Wt Readings from Last 3 Encounters:  06/28/24 216 lb 12.8 oz (98.3 kg)  05/31/24 220 lb 6.4 oz (100 kg)  05/03/24 223 lb (101.2 kg)   Physical Exam HENT:     Head: Normocephalic and atraumatic.     Right Ear: Tympanic membrane, ear canal and external ear normal.     Left Ear: Tympanic membrane, ear canal and external ear normal.     Nose: Nose normal.     Mouth/Throat:     Mouth: Mucous membranes are moist.     Pharynx: Oropharynx is clear.     Comments: Tongue unremarkable. Eyes:     Extraocular Movements: Extraocular movements intact.     Conjunctiva/sclera: Conjunctivae normal.     Pupils: Pupils are equal, round, and reactive to light.  Cardiovascular:     Rate and Rhythm: Normal rate and regular rhythm.     Pulses: Normal pulses.     Heart sounds: Normal heart sounds.  Pulmonary:     Effort: Pulmonary effort is normal.     Breath sounds: Normal breath sounds.  Musculoskeletal:        General: Normal range of motion.     Cervical back: Normal range of motion and neck supple.  Neurological:     General: No focal deficit present.     Mental Status: She is alert and oriented to person, place, and time.  Psychiatric:        Mood and Affect: Mood normal.         Behavior: Behavior normal.    ASSESSMENT AND PLAN: 1. Type 2 diabetes mellitus with hyperglycemia, without long-term current use of insulin (HCC) (Primary) - Continue Glimepiride  as prescribed.  - Hemoglobin A1c result pending. - Discussed the importance of healthy eating habits, low-carbohydrate diet, low-sugar diet, regular aerobic exercise (at  least 150 minutes a week as tolerated) and medication compliance to achieve or maintain control of diabetes. Counseled on medication adherence/adverse effects. - Follow-up with primary provider as scheduled. - POCT glycosylated hemoglobin (Hb A1C) - glimepiride  (AMARYL ) 4 MG tablet; Take 2 tablets (8 mg total) by mouth daily before breakfast.  Dispense: 180 tablet; Refill: 0  2. Encounter for weight management 3. BMI 40.0-44.9, adult [facility]) - Patient lost 4 pounds since previous office visit.  - Increase Phentermine  from 30 mg to 37.5 mg as prescribed. Counseled on medication adherence/adverse effects.  - Follow-up with primary provider in 4 weeks or sooner if needed.  - phentermine  37.5 MG capsule; Take 1 capsule (37.5 mg total) by mouth every morning.  Dispense: 30 capsule; Refill: 0  4. Soreness of tongue - Amoxicillin -Clavulanate as prescribed. Counseled on medication adherence/adverse effects.  - Follow-up with primary provider as scheduled. - amoxicillin -clavulanate (AUGMENTIN ) 875-125 MG tablet; Take 1 tablet by mouth 2 (two) times daily.  Dispense: 20 tablet; Refill: 0   Patient was given the opportunity to ask questions.  Patient verbalized understanding of the plan and was able to repeat key elements of the plan. Patient was given clear instructions to go to Emergency Department or return to medical center if symptoms don't improve, worsen, or new problems develop.The patient verbalized understanding.   Orders Placed This Encounter  Procedures   POCT glycosylated hemoglobin (Hb A1C)     Requested Prescriptions   Signed  Prescriptions Disp Refills   glimepiride  (AMARYL ) 4 MG tablet 180 tablet 0    Sig: Take 2 tablets (8 mg total) by mouth daily before breakfast.   phentermine  37.5 MG capsule 30 capsule 0    Sig: Take 1 capsule (37.5 mg total) by mouth every morning.   amoxicillin -clavulanate (AUGMENTIN ) 875-125 MG tablet 20 tablet 0    Sig: Take 1 tablet by mouth 2 (two) times daily.    Follow-up with primary provider as scheduled.  Greig JINNY Drones, NP

## 2024-06-28 NOTE — Progress Notes (Signed)
 4 week follow up, patient mouth is sore and dry on the inside,

## 2024-07-26 ENCOUNTER — Ambulatory Visit: Admitting: Family

## 2024-08-03 ENCOUNTER — Ambulatory Visit: Admitting: Family

## 2024-08-03 ENCOUNTER — Encounter: Payer: Self-pay | Admitting: Family

## 2024-08-03 ENCOUNTER — Other Ambulatory Visit: Payer: Self-pay | Admitting: Family

## 2024-08-03 VITALS — BP 124/85 | HR 98 | Ht 62.0 in | Wt 211.0 lb

## 2024-08-03 DIAGNOSIS — Z6838 Body mass index (BMI) 38.0-38.9, adult: Secondary | ICD-10-CM

## 2024-08-03 DIAGNOSIS — I10 Essential (primary) hypertension: Secondary | ICD-10-CM | POA: Diagnosis not present

## 2024-08-03 DIAGNOSIS — E119 Type 2 diabetes mellitus without complications: Secondary | ICD-10-CM

## 2024-08-03 DIAGNOSIS — E1165 Type 2 diabetes mellitus with hyperglycemia: Secondary | ICD-10-CM | POA: Diagnosis not present

## 2024-08-03 DIAGNOSIS — Z7984 Long term (current) use of oral hypoglycemic drugs: Secondary | ICD-10-CM

## 2024-08-03 DIAGNOSIS — Z7689 Persons encountering health services in other specified circumstances: Secondary | ICD-10-CM | POA: Diagnosis not present

## 2024-08-03 MED ORDER — PHENTERMINE HCL 37.5 MG PO CAPS: 37.5000 mg | ORAL_CAPSULE | ORAL | 0 refills | Status: DC

## 2024-08-03 MED ORDER — AMLODIPINE BESYLATE 5 MG PO TABS: 5.0000 mg | ORAL_TABLET | Freq: Every day | ORAL | 0 refills | Status: DC

## 2024-08-03 MED ORDER — GLIMEPIRIDE 4 MG PO TABS: 8.0000 mg | ORAL_TABLET | Freq: Every day | ORAL | 0 refills | Status: DC

## 2024-08-03 NOTE — Progress Notes (Signed)
 Patient ID: Kelly Cooper, female    DOB: 1971-06-20  MRN: [id number]  CC: Chronic Conditions Follow-Up  Subjective: Kelly Cooper is a 53 y.o. female who presents for chronic conditions follow-up.   Her concerns today include:  - Doing well on Amlodipine , no issues/concerns. She does not complain of red flag symptoms such as but not limited to chest pain, shortness of breath, worst headache of life, nausea/vomiting.  - Doing well on Glimepiride , no issues/concerns. Denies red flag symptoms associated with diabetes.  - Due for diabetic eye exam. - Doing well on Phentermine , no issues/concerns. She watches what she eats. She does not exercise. States thinks dry mouth related to Phentermine  and tolerable for now.   Patient Active Problem List   Diagnosis Date Noted   Diverticulosis of colon without hemorrhage 07/23/2015   Dysphagia 07/23/2015     Current Outpatient Medications on File Prior to Visit  Medication Sig Dispense Refill   esomeprazole (NEXIUM) 40 MG capsule Take 1 capsule (40 mg total) by mouth daily at 12 noon. 30 minutes before breakfast 30 capsule 3   gabapentin  (NEURONTIN ) 100 MG capsule Take 1 capsule (100 mg total) by mouth 3 (three) times daily. 90 capsule 3   amoxicillin -clavulanate (AUGMENTIN ) 875-125 MG tablet Take 1 tablet by mouth 2 (two) times daily. (Patient not taking: Reported on 08/03/2024) 20 tablet 0   gabapentin  (NEURONTIN ) 300 MG capsule Take 300 mg by mouth daily. (Patient not taking: Reported on 08/03/2024)     glimepiride  (AMARYL ) 2 MG tablet Take 1 tablet (2 mg total) by mouth daily. (Patient not taking: Reported on 08/03/2024) 90 tablet 0   glimepiride  (AMARYL ) 4 MG tablet Take 1 tablet (4 mg total) by mouth daily with breakfast. (Patient not taking: Reported on 08/03/2024) 90 tablet 0   JARDIANCE 25 MG TABS tablet Take 25 mg by mouth daily. (Patient not taking: Reported on 08/03/2024)     No current facility-administered medications on file prior to  visit.    Allergies  Allergen Reactions   Losartan Cough    Hoarseness and coughing and weight loss    Social History   Socioeconomic History   Marital status: Single    Spouse name: Not on file   Number of children: 3   Years of education: Not on file   Highest education level: 12th grade  Occupational History   Occupation: Surveyor, minerals  Tobacco Use   Smoking status: Never   Smokeless tobacco: Never  Substance and Sexual Activity   Alcohol use: Yes    Alcohol/week: 0.0 standard drinks of alcohol    Comment: occ   Drug use: No   Sexual activity: Not on file  Other Topics Concern   Not on file  Social History Narrative   Not on file   Social Drivers of Health   Financial Resource Strain: Medium Risk (05/03/2024)   Overall Financial Resource Strain (CARDIA)    Difficulty of Paying Living Expenses: Somewhat hard  Food Insecurity: Food Insecurity Present (05/03/2024)   Hunger Vital Sign    Worried About Running Out of Food in the Last Year: Often true    Ran Out of Food in the Last Year: Often true  Transportation Needs: No Transportation Needs (05/03/2024)   PRAPARE - Administrator, Civil Service (Medical): No    Lack of Transportation (Non-Medical): No  Physical Activity: Unknown (05/03/2024)   Exercise Vital Sign    Days of Exercise per Week: 0 days  Minutes of Exercise per Session: Not on file  Stress: Stress Concern Present (05/03/2024)   [employer] of Occupational Health - Occupational Stress Questionnaire    Feeling of Stress : Rather much  Social Connections: Unknown (05/03/2024)   Social Connection and Isolation Panel    Frequency of Communication with Friends and Family: More than three times a week    Frequency of Social Gatherings with Friends and Family: Twice a week    Attends Religious Services: Patient declined    Database administrator or Organizations: No    Attends Engineer, structural: Not on file     Marital Status: Patient declined  Intimate Partner Violence: Not At Risk (05/31/2024)   Humiliation, Afraid, Rape, and Kick questionnaire    Fear of Current or Ex-Partner: No    Emotionally Abused: No    Physically Abused: No    Sexually Abused: No    Family History  Problem Relation Age of Onset   Diabetes Maternal Grandfather    Colon cancer Maternal Grandfather    Prostate cancer Maternal Grandfather    Diabetes Paternal Aunt    Diabetes Paternal Uncle    Kidney disease Father    Lung cancer Father    Esophageal cancer Neg Hx    Stomach cancer Neg Hx    Rectal cancer Neg Hx     Past Surgical History:  Procedure Laterality Date   ABDOMINAL HYSTERECTOMY     partial    TUBAL LIGATION     WISDOM TOOTH EXTRACTION     one tooth removed    ROS: Review of Systems Negative except as stated above  PHYSICAL EXAM: BP 124/85   Pulse 98   Ht 5' 2 (1.575 m)   Wt 211 lb (95.7 kg)   SpO2 95%   BMI 38.59 kg/m   Wt Readings from Last 3 Encounters:  08/03/24 211 lb (95.7 kg)  06/28/24 216 lb 12.8 oz (98.3 kg)  05/31/24 220 lb 6.4 oz (100 kg)   Physical Exam HENT:     Head: Normocephalic and atraumatic.     Nose: Nose normal.     Mouth/Throat:     Mouth: Mucous membranes are moist.     Pharynx: Oropharynx is clear.  Eyes:     Extraocular Movements: Extraocular movements intact.     Conjunctiva/sclera: Conjunctivae normal.     Pupils: Pupils are equal, round, and reactive to light.  Cardiovascular:     Rate and Rhythm: Normal rate and regular rhythm.     Pulses: Normal pulses.     Heart sounds: Normal heart sounds.  Pulmonary:     Effort: Pulmonary effort is normal.     Breath sounds: Normal breath sounds.  Musculoskeletal:        General: Normal range of motion.     Cervical back: Normal range of motion and neck supple.  Neurological:     General: No focal deficit present.     Mental Status: She is alert and oriented to person, place, and time.  Psychiatric:         Mood and Affect: Mood normal.        Behavior: Behavior normal.     ASSESSMENT AND PLAN: 1. Primary hypertension (Primary) - Continue Amlodipine  as prescribed.  - Counseled on blood pressure goal of less than 130/80, low-sodium, DASH diet, medication compliance, and 150 minutes of moderate intensity exercise per week as tolerated. Counseled on medication adherence and adverse effects. - Follow-up with primary  provider in 3 months or sooner if needed.  - amLODipine  (NORVASC ) 5 MG tablet; Take 1 tablet (5 mg total) by mouth daily.  Dispense: 90 tablet; Refill: 0  2. Type 2 diabetes mellitus with hyperglycemia, without long-term current use of insulin (HCC) - Hemoglobin A1c 7.0% on 06/28/2024. Goal 7%.  - Continue Glimepiride  as prescribed. - Discussed the importance of healthy eating habits, low-carbohydrate diet, low-sugar diet, regular aerobic exercise (at least 150 minutes a week as tolerated) and medication compliance to achieve or maintain control of diabetes. Counseled on medication adherence/adverse effects.  - Follow-up with primary provider in 3 months or sooner if needed.  - glimepiride  (AMARYL ) 4 MG tablet; Take 2 tablets (8 mg total) by mouth daily before breakfast.  Dispense: 180 tablet; Refill: 0  3. Diabetic eye exam [facility]) - Referral to Ophthalmology for evaluation/management. - Ambulatory referral to Ophthalmology  4. Encounter for weight management 5. BMI 38.0-38.9,adult - Patient lost 5 pounds since previous office visit.  - Continue Phentermine  as prescribed. Counseled on medication adherence/adverse effects.  - I did check the [place]  prescription drug database.  - Follow-up with primary provider in 4 weeks or sooner if needed.  - phentermine  37.5 MG capsule; Take 1 capsule (37.5 mg total) by mouth every morning.  Dispense: 30 capsule; Refill: 0   Patient was given the opportunity to ask questions.  Patient verbalized understanding of the plan and was able  to repeat key elements of the plan. Patient was given clear instructions to go to Emergency Department or return to medical center if symptoms don't improve, worsen, or new problems develop.The patient verbalized understanding.   Orders Placed This Encounter  Procedures   Ambulatory referral to Ophthalmology     Requested Prescriptions   Signed Prescriptions Disp Refills   amLODipine  (NORVASC ) 5 MG tablet 90 tablet 0    Sig: Take 1 tablet (5 mg total) by mouth daily.   phentermine  37.5 MG capsule 30 capsule 0    Sig: Take 1 capsule (37.5 mg total) by mouth every morning.   glimepiride  (AMARYL ) 4 MG tablet 180 tablet 0    Sig: Take 2 tablets (8 mg total) by mouth daily before breakfast.    Return in about 3 months (around 11/03/2024) for Follow-Up or next available chronic conditions.  Greig JINNY Drones, NP

## 2024-08-12 ENCOUNTER — Ambulatory Visit: Admitting: Podiatry

## 2024-08-17 ENCOUNTER — Encounter: Payer: Self-pay | Admitting: Podiatry

## 2024-08-17 ENCOUNTER — Ambulatory Visit (INDEPENDENT_AMBULATORY_CARE_PROVIDER_SITE_OTHER): Admitting: Podiatry

## 2024-08-17 VITALS — Ht 62.0 in | Wt 211.0 lb

## 2024-08-17 DIAGNOSIS — M792 Neuralgia and neuritis, unspecified: Secondary | ICD-10-CM | POA: Diagnosis not present

## 2024-08-17 DIAGNOSIS — E0841 Diabetes mellitus due to underlying condition with diabetic mononeuropathy: Secondary | ICD-10-CM | POA: Diagnosis not present

## 2024-08-17 MED ORDER — GABAPENTIN 100 MG PO CAPS: 100.0000 mg | ORAL_CAPSULE | Freq: Three times a day (TID) | ORAL | 6 refills | Status: DC

## 2024-08-17 NOTE — Progress Notes (Signed)
 Patient presents follow-up neuropathy some improvement.  Says probably 50% better.  He is taking gabapentin  100 mg 1 p.o. 3 times daily.  No new symptoms.   Physical exam:  General appearance: Pleasant, and in no acute distress. AOx3.  Vascular: Pedal pulses: DP 2/4 bilaterally, PT 2/4 bilaterally. Mild edema lower legs bilaterally. Capillary fill time immediate b/l.  Neurological: Decreased vibratory sensation bilaterally.  Monofilament sensation intact bilaterally.  Normal Achilles tendon reflex bilaterally  Dermatologic:   Skin normal temperature bilaterally.  Skin normal color, tone, and texture bilaterally.   Musculoskeletal:      Diagnosis: 1.  Neuritis feet bilaterally 2.  Peripheral neuropathy with type 2 diabetes.  Plan: -Established office visit for evaluation and management level 3. - Discussed with her the gabapentin  which seems to be helping with her symptoms.  She has been taking that for 3 months.  For right now we will keep her on the same dose since she how she does over the next 6 months.  If she gets known further improvement we can try increasing the dose of the gabapentin . -Gabapentin  100 mg 1 p.o. 3 times daily, 6 refills  Return 6 months follow-up neuropathy

## 2024-09-13 ENCOUNTER — Encounter: Payer: Self-pay | Admitting: Family

## 2024-09-13 ENCOUNTER — Ambulatory Visit: Admitting: Family

## 2024-09-13 VITALS — BP 140/92 | HR 83 | Temp 98.2°F | Resp 16 | Ht 62.0 in | Wt 212.8 lb

## 2024-09-13 DIAGNOSIS — Z23 Encounter for immunization: Secondary | ICD-10-CM

## 2024-09-13 DIAGNOSIS — Z760 Encounter for issue of repeat prescription: Secondary | ICD-10-CM | POA: Diagnosis not present

## 2024-09-13 DIAGNOSIS — Z7689 Persons encountering health services in other specified circumstances: Secondary | ICD-10-CM | POA: Diagnosis not present

## 2024-09-13 DIAGNOSIS — I10 Essential (primary) hypertension: Secondary | ICD-10-CM

## 2024-09-13 DIAGNOSIS — Z6838 Body mass index (BMI) 38.0-38.9, adult: Secondary | ICD-10-CM | POA: Diagnosis not present

## 2024-09-13 MED ORDER — AMLODIPINE BESYLATE 10 MG PO TABS: 10.0000 mg | ORAL_TABLET | Freq: Every day | ORAL | 0 refills | Status: DC

## 2024-09-13 MED ORDER — PHENTERMINE HCL 30 MG PO CAPS: 30.0000 mg | ORAL_CAPSULE | ORAL | 0 refills | Status: DC

## 2024-09-13 NOTE — Progress Notes (Signed)
 Patient ID: Kelly Cooper, female    DOB: 1971-04-29  MRN: [id number]  CC: Chronic Conditions Follow-Up  Subjective: Kelly Cooper is a 53 y.o. female who presents for chronic conditions follow-up.  Her concerns today include:  - Doing well on Amlodipine , no issues/concerns. She does not complain of red flag symptoms such as but not limited to chest pain, shortness of breath, worst headache of life, nausea/vomiting.  - States she quit taking Phentermine  37.5 mg and dry mouth improved. States she prefers Phentermine  30 mg.   Patient Active Problem List   Diagnosis Date Noted   Diverticulosis of colon without hemorrhage 07/23/2015   Dysphagia 07/23/2015     Current Outpatient Medications on File Prior to Visit  Medication Sig Dispense Refill   esomeprazole (NEXIUM) 40 MG capsule Take 1 capsule (40 mg total) by mouth daily at 12 noon. 30 minutes before breakfast 30 capsule 3   glimepiride  (AMARYL ) 4 MG tablet Take 2 tablets (8 mg total) by mouth daily before breakfast. 180 tablet 0   amoxicillin -clavulanate (AUGMENTIN ) 875-125 MG tablet Take 1 tablet by mouth 2 (two) times daily. (Patient not taking: Reported on 08/03/2024) 20 tablet 0   gabapentin  (NEURONTIN ) 100 MG capsule Take 1 capsule (100 mg total) by mouth 3 (three) times daily. (Patient not taking: Reported on 09/13/2024) 90 capsule 3   gabapentin  (NEURONTIN ) 100 MG capsule Take 1 capsule (100 mg total) by mouth 3 (three) times daily. 90 capsule 6   gabapentin  (NEURONTIN ) 300 MG capsule Take 300 mg by mouth daily. (Patient not taking: Reported on 08/03/2024)     glimepiride  (AMARYL ) 2 MG tablet Take 1 tablet (2 mg total) by mouth daily. (Patient not taking: Reported on 08/03/2024) 90 tablet 0   glimepiride  (AMARYL ) 4 MG tablet Take 1 tablet (4 mg total) by mouth daily with breakfast. (Patient not taking: Reported on 08/03/2024) 90 tablet 0   JARDIANCE 25 MG TABS tablet Take 25 mg by mouth daily. (Patient not taking: Reported on  08/03/2024)     No current facility-administered medications on file prior to visit.    Allergies  Allergen Reactions   Lisinopril Other (See Comments)   Losartan Cough    Hoarseness and coughing and weight loss    Social History   Socioeconomic History   Marital status: Single    Spouse name: Not on file   Number of children: 3   Years of education: Not on file   Highest education level: 12th grade  Occupational History   Occupation: Surveyor, minerals  Tobacco Use   Smoking status: Never   Smokeless tobacco: Never  Substance and Sexual Activity   Alcohol use: Yes    Alcohol/week: 0.0 standard drinks of alcohol    Comment: occ   Drug use: No   Sexual activity: Not on file  Other Topics Concern   Not on file  Social History Narrative   Not on file   Social Drivers of Health   Financial Resource Strain: Medium Risk (05/03/2024)   Overall Financial Resource Strain (CARDIA)    Difficulty of Paying Living Expenses: Somewhat hard  Food Insecurity: Food Insecurity Present (05/03/2024)   Hunger Vital Sign    Worried About Running Out of Food in the Last Year: Often true    Ran Out of Food in the Last Year: Often true  Transportation Needs: No Transportation Needs (05/03/2024)   PRAPARE - Transportation    Lack of Transportation (Medical): No    Lack of  Transportation (Non-Medical): No  Physical Activity: Inactive (09/13/2024)   Exercise Vital Sign    Days of Exercise per Week: 0 days    Minutes of Exercise per Session: 0 min  Stress: Stress Concern Present (05/03/2024)   [employer] of Occupational Health - Occupational Stress Questionnaire    Feeling of Stress : Rather much  Social Connections: Unknown (05/03/2024)   Social Connection and Isolation Panel    Frequency of Communication with Friends and Family: More than three times a week    Frequency of Social Gatherings with Friends and Family: Twice a week    Attends Religious Services: Patient declined     Database administrator or Organizations: No    Attends Engineer, structural: Not on file    Marital Status: Patient declined  Intimate Partner Violence: Not At Risk (05/31/2024)   Humiliation, Afraid, Rape, and Kick questionnaire    Fear of Current or Ex-Partner: No    Emotionally Abused: No    Physically Abused: No    Sexually Abused: No    Family History  Problem Relation Age of Onset   Diabetes Maternal Grandfather    Colon cancer Maternal Grandfather    Prostate cancer Maternal Grandfather    Diabetes Paternal Aunt    Diabetes Paternal Uncle    Kidney disease Father    Lung cancer Father    Esophageal cancer Neg Hx    Stomach cancer Neg Hx    Rectal cancer Neg Hx     Past Surgical History:  Procedure Laterality Date   ABDOMINAL HYSTERECTOMY     partial    TUBAL LIGATION     WISDOM TOOTH EXTRACTION     one tooth removed    ROS: Review of Systems Negative except as stated above  PHYSICAL EXAM: BP (!) 140/92   Pulse 83   Temp 98.2 F (36.8 C) (Oral)   Resp 16   Ht 5' 2 (1.575 m)   Wt 212 lb 12.8 oz (96.5 kg)   SpO2 94%   BMI 38.92 kg/m   Wt Readings from Last 3 Encounters:  09/13/24 212 lb 12.8 oz (96.5 kg)  08/17/24 211 lb (95.7 kg)  08/03/24 211 lb (95.7 kg)   Physical Exam HENT:     Head: Normocephalic and atraumatic.     Nose: Nose normal.     Mouth/Throat:     Mouth: Mucous membranes are moist.     Pharynx: Oropharynx is clear.  Eyes:     Extraocular Movements: Extraocular movements intact.     Conjunctiva/sclera: Conjunctivae normal.     Pupils: Pupils are equal, round, and reactive to light.  Cardiovascular:     Rate and Rhythm: Normal rate and regular rhythm.     Pulses: Normal pulses.     Heart sounds: Normal heart sounds.  Pulmonary:     Effort: Pulmonary effort is normal.     Breath sounds: Normal breath sounds.  Musculoskeletal:        General: Normal range of motion.     Cervical back: Normal range of motion and  neck supple.  Neurological:     General: No focal deficit present.     Mental Status: She is alert and oriented to person, place, and time.  Psychiatric:        Mood and Affect: Mood normal.        Behavior: Behavior normal.    ASSESSMENT AND PLAN: 1. Primary hypertension (Primary) - Blood pressure not at goal  during today's visit. Patient asymptomatic without chest pressure, chest pain, palpitations, shortness of breath, worst headache of life, and any additional red flag symptoms. - Increase Amlodipine  from 5 mg to 10 mg as prescribed.  - Counseled on blood pressure goal of less than 130/80, low-sodium, DASH diet, medication compliance, and 150 minutes of moderate intensity exercise per week as tolerated. Counseled on medication adherence and adverse effects. - Follow-up with primary provider in 4 weeks or sooner if needed. - amLODipine  (NORVASC ) 10 MG tablet; Take 1 tablet (10 mg total) by mouth daily.  Dispense: 90 tablet; Refill: 0  2. Encounter for weight management 3. BMI 38.0-38.9,adult - Patient gained 1 pound since previous office visit.  - Decrease Phentermine  from 37.5 mg to 30 mg as prescribed with goal of decreasing side effect of dry mouth.  - Follow-up with primary provider in 4 weeks or sooner if needed.  - phentermine  30 MG capsule; Take 1 capsule (30 mg total) by mouth every morning.  Dispense: 30 capsule; Refill: 0  4. Immunization due - Administered. - Flu vaccine trivalent PF, 6mos and older(Flulaval,Afluria,Fluarix,Fluzone)   Patient was given the opportunity to ask questions.  Patient verbalized understanding of the plan and was able to repeat key elements of the plan. Patient was given clear instructions to go to Emergency Department or return to medical center if symptoms don't improve, worsen, or new problems develop.The patient verbalized understanding.   Orders Placed This Encounter  Procedures   Flu vaccine trivalent PF, 6mos and  older(Flulaval,Afluria,Fluarix,Fluzone)     Requested Prescriptions   Signed Prescriptions Disp Refills   amLODipine  (NORVASC ) 10 MG tablet 90 tablet 0    Sig: Take 1 tablet (10 mg total) by mouth daily.   phentermine  30 MG capsule 30 capsule 0    Sig: Take 1 capsule (30 mg total) by mouth every morning.    Return in 4 weeks (on 10/11/2024) for Follow-Up or next available chronic conditions.  Greig JINNY Drones, NP

## 2024-09-13 NOTE — Progress Notes (Signed)
 One month follow up.  Patient no taken weight loss medication she thinks she had a allergic reaction to it so she stop taking it

## 2024-10-11 LAB — HM MAMMOGRAPHY

## 2024-10-13 ENCOUNTER — Ambulatory Visit: Payer: Self-pay | Admitting: Family

## 2024-10-13 ENCOUNTER — Encounter: Payer: Self-pay | Admitting: Family

## 2024-10-25 ENCOUNTER — Encounter: Payer: Self-pay | Admitting: Family

## 2024-10-25 ENCOUNTER — Ambulatory Visit: Payer: Self-pay | Admitting: Family

## 2024-10-25 ENCOUNTER — Ambulatory Visit: Admitting: Family

## 2024-10-25 VITALS — BP 120/80 | HR 90 | Temp 98.7°F | Resp 16 | Ht 62.0 in | Wt 211.8 lb

## 2024-10-25 DIAGNOSIS — R0989 Other specified symptoms and signs involving the circulatory and respiratory systems: Secondary | ICD-10-CM | POA: Diagnosis not present

## 2024-10-25 DIAGNOSIS — E1165 Type 2 diabetes mellitus with hyperglycemia: Secondary | ICD-10-CM

## 2024-10-25 DIAGNOSIS — Z8639 Personal history of other endocrine, nutritional and metabolic disease: Secondary | ICD-10-CM

## 2024-10-25 DIAGNOSIS — K149 Disease of tongue, unspecified: Secondary | ICD-10-CM

## 2024-10-25 DIAGNOSIS — J029 Acute pharyngitis, unspecified: Secondary | ICD-10-CM

## 2024-10-25 DIAGNOSIS — I10 Essential (primary) hypertension: Secondary | ICD-10-CM | POA: Diagnosis not present

## 2024-10-25 DIAGNOSIS — Z7984 Long term (current) use of oral hypoglycemic drugs: Secondary | ICD-10-CM

## 2024-10-25 DIAGNOSIS — J358 Other chronic diseases of tonsils and adenoids: Secondary | ICD-10-CM

## 2024-10-25 LAB — POCT RAPID STREP A (OFFICE): Rapid Strep A Screen: NEGATIVE

## 2024-10-25 MED ORDER — GLIMEPIRIDE 4 MG PO TABS: 8.0000 mg | ORAL_TABLET | Freq: Every day | ORAL | 0 refills | Status: DC

## 2024-10-25 MED ORDER — HYDROCHLOROTHIAZIDE 12.5 MG PO TABS: 12.5000 mg | ORAL_TABLET | Freq: Every day | ORAL | 0 refills | Status: DC

## 2024-10-25 NOTE — Progress Notes (Signed)
 4 week follow up, still having issues with her tongue,  patient has been feeling dizzy, patient thinks she is having a allergic reaction to her b/p medication

## 2024-10-25 NOTE — Progress Notes (Signed)
 Patient ID: Kelly Cooper, female    DOB: 09/05/1971  MRN: [id number]  CC: Chronic Conditions Follow-Up  Subjective: Kelly Cooper is a 53 y.o. female who presents for chronic conditions follow-up.  Her concerns today include:  - States continued irritation of tongue and intermittent dizziness. States thinks may be related to possibly allergic to Amlodipine . States in the past she felt similar when she had an allergic reaction to Lisinopril and Losartan. She  does not complain of red flag symptoms such as but not limited to chest pain, shortness of breath, worst headache of life, nausea/vomiting. - Doing well on Glimepiride , no issues/concerns. Denies red flag symptoms associated with diabetes.   - States she recently noticed tonsil stones. Denies red flag symptoms. States years ago she was supposed to have tonsils removed and never did.  - States she considered if irritation of tongue and intermittent dizziness related to thyroid  issues after looking on [employer]. States years ago she was told she had a thyroid  nodule which was drained. Reports she has not followed-up since then.  - States she is no longer taking Phentermine  since tongue irritation began. - States nasal drainage and congestion. Denies red flag symptoms.   Patient Active Problem List   Diagnosis Date Noted   Diverticulosis of colon without hemorrhage 07/23/2015   Dysphagia 07/23/2015     Current Outpatient Medications on File Prior to Visit  Medication Sig Dispense Refill   amLODipine  (NORVASC ) 10 MG tablet Take 1 tablet (10 mg total) by mouth daily. 90 tablet 0   esomeprazole (NEXIUM) 40 MG capsule Take 1 capsule (40 mg total) by mouth daily at 12 noon. 30 minutes before breakfast 30 capsule 3   gabapentin  (NEURONTIN ) 100 MG capsule Take 1 capsule (100 mg total) by mouth 3 (three) times daily. 90 capsule 6   phentermine  30 MG capsule Take 1 capsule (30 mg total) by mouth every morning. 30 capsule 0    amoxicillin -clavulanate (AUGMENTIN ) 875-125 MG tablet Take 1 tablet by mouth 2 (two) times daily. (Patient not taking: Reported on 08/03/2024) 20 tablet 0   gabapentin  (NEURONTIN ) 100 MG capsule Take 1 capsule (100 mg total) by mouth 3 (three) times daily. (Patient not taking: Reported on 09/13/2024) 90 capsule 3   gabapentin  (NEURONTIN ) 300 MG capsule Take 300 mg by mouth daily. (Patient not taking: Reported on 08/03/2024)     glimepiride  (AMARYL ) 2 MG tablet Take 1 tablet (2 mg total) by mouth daily. (Patient not taking: Reported on 08/03/2024) 90 tablet 0   glimepiride  (AMARYL ) 4 MG tablet Take 1 tablet (4 mg total) by mouth daily with breakfast. (Patient not taking: Reported on 08/03/2024) 90 tablet 0   JARDIANCE 25 MG TABS tablet Take 25 mg by mouth daily. (Patient not taking: Reported on 08/03/2024)     No current facility-administered medications on file prior to visit.    Allergies  Allergen Reactions   Lisinopril Other (See Comments)   Losartan Cough    Hoarseness and coughing and weight loss    Social History   Socioeconomic History   Marital status: Single    Spouse name: Not on file   Number of children: 3   Years of education: Not on file   Highest education level: 12th grade  Occupational History   Occupation: surveyor, minerals  Tobacco Use   Smoking status: Never   Smokeless tobacco: Never  Substance and Sexual Activity   Alcohol use: Yes    Alcohol/week: 0.0 standard drinks of  alcohol    Comment: occ   Drug use: No   Sexual activity: Not on file  Other Topics Concern   Not on file  Social History Narrative   Not on file   Social Drivers of Health   Financial Resource Strain: Medium Risk (05/03/2024)   Overall Financial Resource Strain (CARDIA)    Difficulty of Paying Living Expenses: Somewhat hard  Food Insecurity: Food Insecurity Present (05/03/2024)   Hunger Vital Sign    Worried About Running Out of Food in the Last Year: Often true    Ran Out of  Food in the Last Year: Often true  Transportation Needs: No Transportation Needs (05/03/2024)   PRAPARE - Administrator, Civil Service (Medical): No    Lack of Transportation (Non-Medical): No  Physical Activity: Inactive (09/13/2024)   Exercise Vital Sign    Days of Exercise per Week: 0 days    Minutes of Exercise per Session: 0 min  Stress: Stress Concern Present (05/03/2024)   [employer] of Occupational Health - Occupational Stress Questionnaire    Feeling of Stress : Rather much  Social Connections: Unknown (05/03/2024)   Social Connection and Isolation Panel    Frequency of Communication with Friends and Family: More than three times a week    Frequency of Social Gatherings with Friends and Family: Twice a week    Attends Religious Services: Patient declined    Database Administrator or Organizations: No    Attends Engineer, Structural: Not on file    Marital Status: Patient declined  Intimate Partner Violence: Not At Risk (05/31/2024)   Humiliation, Afraid, Rape, and Kick questionnaire    Fear of Current or Ex-Partner: No    Emotionally Abused: No    Physically Abused: No    Sexually Abused: No    Family History  Problem Relation Age of Onset   Diabetes Maternal Grandfather    Colon cancer Maternal Grandfather    Prostate cancer Maternal Grandfather    Diabetes Paternal Aunt    Diabetes Paternal Uncle    Kidney disease Father    Lung cancer Father    Esophageal cancer Neg Hx    Stomach cancer Neg Hx    Rectal cancer Neg Hx     Past Surgical History:  Procedure Laterality Date   ABDOMINAL HYSTERECTOMY     partial    TUBAL LIGATION     WISDOM TOOTH EXTRACTION     one tooth removed    ROS: Review of Systems Negative except as stated above  PHYSICAL EXAM: BP 120/80   Pulse 90   Temp 98.7 F (37.1 C) (Oral)   Resp 16   Ht 5' 2 (1.575 m)   Wt 211 lb 12.8 oz (96.1 kg)   SpO2 95%   BMI 38.74 kg/m   Physical Exam HENT:      Head: Normocephalic and atraumatic.     Comments: Minimal pinpoint spots on tongue.    Right Ear: Tympanic membrane, ear canal and external ear normal.     Left Ear: Tympanic membrane, ear canal and external ear normal.     Nose: Nose normal.     Mouth/Throat:     Mouth: Mucous membranes are moist.     Pharynx: Oropharynx is clear.  Eyes:     Extraocular Movements: Extraocular movements intact.     Conjunctiva/sclera: Conjunctivae normal.     Pupils: Pupils are equal, round, and reactive to light.  Cardiovascular:  Rate and Rhythm: Normal rate and regular rhythm.     Pulses: Normal pulses.     Heart sounds: Normal heart sounds.  Pulmonary:     Effort: Pulmonary effort is normal.     Breath sounds: Normal breath sounds.  Musculoskeletal:        General: Normal range of motion.     Cervical back: Normal range of motion and neck supple.  Neurological:     General: No focal deficit present.     Mental Status: She is alert and oriented to person, place, and time.  Psychiatric:        Mood and Affect: Mood normal.        Behavior: Behavior normal.    ASSESSMENT AND PLAN: 1. Primary hypertension (Primary) - Amlodipine  discontinued due to potential for side effects.  - Patient intolerant to Lisinopril and Losartan.  - Trial Hydrochlorothiazide as prescribed.  - Routine screening.  - Counseled on blood pressure goal of less than 130/80, low-sodium, DASH diet, medication compliance, and 150 minutes of moderate intensity exercise per week as tolerated. Counseled on medication adherence and adverse effects. - Referral to Cardiology for evaluation/management.  - Follow-up with primary provider as scheduled.  - hydrochlorothiazide (HYDRODIURIL) 12.5 MG tablet; Take 1 tablet (12.5 mg total) by mouth daily.  Dispense: 90 tablet; Refill: 0 - Ambulatory referral to Cardiology - Basic Metabolic Panel  2. Type 2 diabetes mellitus with hyperglycemia, without long-term current use of insulin  (HCC) - Hemoglobin A1c result pending.  - Continue Glimepiride  as prescribed.  - Discussed the importance of healthy eating habits, low-carbohydrate diet, low-sugar diet, regular aerobic exercise (at least 150 minutes a week as tolerated) and medication compliance to achieve or maintain control of diabetes. Counseled on medication adherence/adverse effects.  - Follow-up with primary provider as scheduled.  - Hemoglobin A1c - glimepiride  (AMARYL ) 4 MG tablet; Take 2 tablets (8 mg total) by mouth daily before breakfast.  Dispense: 180 tablet; Refill: 0  3. Tonsillith 4. Tongue irritation 5. History of thyroid  nodule - Routine screening.  - US  Thyroid  for evaluation.  - Referral to ENT for evaluation/management.  - Follow-up with primary provider as scheduled. - Ambulatory referral to ENT - Sjogren's syndrome antibods(ssa + ssb) - TSH - US  THYROID ; Future  6. Upper respiratory symptom - Patient today in office with no cardiopulmonary/acute distress.  - Routine screening.  - COVID-19, Flu A+B and RSV - POCT rapid strep A; Future - Culture, Group A Strep   Patient was given the opportunity to ask questions.  Patient verbalized understanding of the plan and was able to repeat key elements of the plan. Patient was given clear instructions to go to Emergency Department or return to medical center if symptoms don't improve, worsen, or new problems develop.The patient verbalized understanding.   Orders Placed This Encounter  Procedures   COVID-19, Flu A+B and RSV   Culture, Group A Strep   US  THYROID    Sjogren's syndrome antibods(ssa + ssb)   TSH   Hemoglobin A1c   Basic Metabolic Panel   Ambulatory referral to Cardiology   Ambulatory referral to ENT   POCT rapid strep A     Requested Prescriptions   Signed Prescriptions Disp Refills   hydrochlorothiazide (HYDRODIURIL) 12.5 MG tablet 90 tablet 0    Sig: Take 1 tablet (12.5 mg total) by mouth daily.   glimepiride  (AMARYL ) 4 MG  tablet 180 tablet 0    Sig: Take 2 tablets (8 mg total) by  mouth daily before breakfast.    Return for Follow-up as needed.  Greig JINNY Chute, NP

## 2024-10-26 LAB — COVID-19, FLU A+B AND RSV
Influenza A, NAA: NOT DETECTED
Influenza B, NAA: NOT DETECTED
RSV, NAA: NOT DETECTED
SARS-CoV-2, NAA: NOT DETECTED

## 2024-10-26 LAB — SJOGREN'S SYNDROME ANTIBODS(SSA + SSB)
ENA SSA (RO) Ab: <0.2 AI (ref 0.0–0.9)
ENA SSB (LA) Ab: <0.2 AI (ref 0.0–0.9)

## 2024-10-26 LAB — BASIC METABOLIC PANEL WITH GFR
BUN/Creatinine Ratio: 19 (ref 9–23)
BUN: 16 mg/dL (ref 6–24)
CO2: 23 mmol/L (ref 20–29)
Calcium: 10.4 mg/dL — ABNORMAL HIGH (ref 8.7–10.2)
Chloride: 101 mmol/L (ref 96–106)
Creatinine, Ser: 0.85 mg/dL (ref 0.57–1.00)
Glucose: 55 mg/dL — ABNORMAL LOW (ref 70–99)
Potassium: 4 mmol/L (ref 3.5–5.2)
Sodium: 142 mmol/L (ref 134–144)
eGFR: 81 mL/min/1.73 (ref >59)

## 2024-10-26 LAB — HEMOGLOBIN A1C
Est. average glucose Bld gHb Est-mCnc: 154 mg/dL
Hgb A1c MFr Bld: 7 % — ABNORMAL HIGH (ref 4.8–5.6)

## 2024-10-26 LAB — TSH: TSH: 0.773 u[IU]/mL (ref 0.450–4.500)

## 2024-10-28 LAB — CULTURE, GROUP A STREP

## 2024-10-28 MED ORDER — AMOXICILLIN-POT CLAVULANATE 875-125 MG PO TABS: 1.0000 | ORAL_TABLET | Freq: Two times a day (BID) | ORAL | 0 refills | Status: DC

## 2024-11-01 ENCOUNTER — Ambulatory Visit
Admission: RE | Admit: 2024-11-01 | Discharge: 2024-11-01 | Disposition: A | Source: Ambulatory Visit | Attending: Family | Admitting: Family

## 2024-11-01 DIAGNOSIS — K149 Disease of tongue, unspecified: Secondary | ICD-10-CM

## 2024-11-01 DIAGNOSIS — J358 Other chronic diseases of tonsils and adenoids: Secondary | ICD-10-CM

## 2024-11-01 DIAGNOSIS — Z8639 Personal history of other endocrine, nutritional and metabolic disease: Secondary | ICD-10-CM

## 2024-11-10 ENCOUNTER — Encounter (INDEPENDENT_AMBULATORY_CARE_PROVIDER_SITE_OTHER): Payer: Self-pay

## 2024-11-24 ENCOUNTER — Encounter: Payer: Self-pay | Admitting: Family

## 2024-11-24 ENCOUNTER — Ambulatory Visit (INDEPENDENT_AMBULATORY_CARE_PROVIDER_SITE_OTHER): Admitting: Family

## 2024-11-24 ENCOUNTER — Telehealth: Payer: Self-pay

## 2024-11-24 VITALS — BP 139/102 | HR 88 | Temp 98.0°F | Resp 16 | Ht 62.0 in | Wt 216.4 lb

## 2024-11-24 DIAGNOSIS — E1165 Type 2 diabetes mellitus with hyperglycemia: Secondary | ICD-10-CM

## 2024-11-24 DIAGNOSIS — R635 Abnormal weight gain: Secondary | ICD-10-CM

## 2024-11-24 DIAGNOSIS — I10 Essential (primary) hypertension: Secondary | ICD-10-CM | POA: Diagnosis not present

## 2024-11-24 MED ORDER — GLIMEPIRIDE 4 MG PO TABS: 8.0000 mg | ORAL_TABLET | Freq: Every day | ORAL | 0 refills | Status: DC

## 2024-11-24 MED ORDER — TIRZEPATIDE 2.5 MG/0.5ML ~~LOC~~ SOAJ: 2.5000 mg | SUBCUTANEOUS | 0 refills | Status: DC

## 2024-11-24 MED ORDER — HYDROCHLOROTHIAZIDE 25 MG PO TABS: 25.0000 mg | ORAL_TABLET | Freq: Every day | ORAL | 0 refills | Status: DC

## 2024-11-24 NOTE — Telephone Encounter (Signed)
 Pharmacy Patient Advocate Encounter  Received notification from [employer] that Prior Authorization for [employer] has been APPROVED from 10/25/2024 to 12/15/2098   PA #/Case ID/Reference #: [id number]

## 2024-11-24 NOTE — Progress Notes (Signed)
 Patient ID: Masie Kinnamon, female    DOB: 14-Oct-1971  MRN: [id number]  CC: Chronic Conditions Follow-Up  Subjective: Cassi Rennels is a 53 y.o. female who presents for chronic conditions follow-up.  Her concerns today include:  - Doing well on Hydrochlorothiazide , no issues/concerns. States since previous office visit she did not establish with Cardiology. She does not complain of red flag symptoms such as but not limited to chest pain, shortness of breath, worst headache of life, nausea/vomiting.  - States she would like to try Mounjaro to assist with diabetes management. Doing well on Glimepiride , no issues/concerns. Denies red flag symptoms associated with diabetes.  - Weight gain. She would like referral to weight specialist.  - States she plans to return call from ENT and schedule appointment.   Patient Active Problem List   Diagnosis Date Noted   Diverticulosis of colon without hemorrhage 07/23/2015   Dysphagia 07/23/2015     Current Outpatient Medications on File Prior to Visit  Medication Sig Dispense Refill   amLODipine  (NORVASC ) 10 MG tablet Take 1 tablet (10 mg total) by mouth daily. 90 tablet 0   esomeprazole (NEXIUM) 40 MG capsule Take 1 capsule (40 mg total) by mouth daily at 12 noon. 30 minutes before breakfast 30 capsule 3   gabapentin  (NEURONTIN ) 100 MG capsule Take 1 capsule (100 mg total) by mouth 3 (three) times daily. 90 capsule 6   amoxicillin -clavulanate (AUGMENTIN ) 875-125 MG tablet Take 1 tablet by mouth 2 (two) times daily. (Patient not taking: Reported on 08/03/2024) 20 tablet 0   amoxicillin -clavulanate (AUGMENTIN ) 875-125 MG tablet Take 1 tablet by mouth 2 (two) times daily. (Patient not taking: Reported on 11/24/2024) 20 tablet 0   gabapentin  (NEURONTIN ) 100 MG capsule Take 1 capsule (100 mg total) by mouth 3 (three) times daily. (Patient not taking: Reported on 09/13/2024) 90 capsule 3   gabapentin  (NEURONTIN ) 300 MG capsule Take 300 mg by mouth daily.  (Patient not taking: Reported on 08/03/2024)     glimepiride  (AMARYL ) 2 MG tablet Take 1 tablet (2 mg total) by mouth daily. (Patient not taking: Reported on 08/03/2024) 90 tablet 0   glimepiride  (AMARYL ) 4 MG tablet Take 1 tablet (4 mg total) by mouth daily with breakfast. (Patient not taking: Reported on 08/03/2024) 90 tablet 0   JARDIANCE 25 MG TABS tablet Take 25 mg by mouth daily. (Patient not taking: Reported on 08/03/2024)     phentermine  30 MG capsule Take 1 capsule (30 mg total) by mouth every morning. 30 capsule 0   No current facility-administered medications on file prior to visit.    Allergies  Allergen Reactions   Lisinopril Other (See Comments)   Losartan Cough    Hoarseness and coughing and weight loss    Social History   Socioeconomic History   Marital status: Single    Spouse name: Not on file   Number of children: 3   Years of education: Not on file   Highest education level: 12th grade  Occupational History   Occupation: surveyor, minerals  Tobacco Use   Smoking status: Never   Smokeless tobacco: Never  Substance and Sexual Activity   Alcohol use: Yes    Alcohol/week: 0.0 standard drinks of alcohol    Comment: occ   Drug use: No   Sexual activity: Not on file  Other Topics Concern   Not on file  Social History Narrative   Not on file   Social Drivers of Health   Financial Resource Strain:  Medium Risk (05/03/2024)   Overall Financial Resource Strain (CARDIA)    Difficulty of Paying Living Expenses: Somewhat hard  Food Insecurity: Food Insecurity Present (05/03/2024)   Hunger Vital Sign    Worried About Running Out of Food in the Last Year: Often true    Ran Out of Food in the Last Year: Often true  Transportation Needs: No Transportation Needs (05/03/2024)   PRAPARE - Administrator, Civil Service (Medical): No    Lack of Transportation (Non-Medical): No  Physical Activity: Inactive (09/13/2024)   Exercise Vital Sign    Days of  Exercise per Week: 0 days    Minutes of Exercise per Session: 0 min  Stress: Stress Concern Present (05/03/2024)   [employer] of Occupational Health - Occupational Stress Questionnaire    Feeling of Stress : Rather much  Social Connections: Unknown (05/03/2024)   Social Connection and Isolation Panel    Frequency of Communication with Friends and Family: More than three times a week    Frequency of Social Gatherings with Friends and Family: Twice a week    Attends Religious Services: Patient declined    Database Administrator or Organizations: No    Attends Engineer, Structural: Not on file    Marital Status: Patient declined  Intimate Partner Violence: Not At Risk (05/31/2024)   Humiliation, Afraid, Rape, and Kick questionnaire    Fear of Current or Ex-Partner: No    Emotionally Abused: No    Physically Abused: No    Sexually Abused: No    Family History  Problem Relation Age of Onset   Diabetes Maternal Grandfather    Colon cancer Maternal Grandfather    Prostate cancer Maternal Grandfather    Diabetes Paternal Aunt    Diabetes Paternal Uncle    Kidney disease Father    Lung cancer Father    Esophageal cancer Neg Hx    Stomach cancer Neg Hx    Rectal cancer Neg Hx     Past Surgical History:  Procedure Laterality Date   ABDOMINAL HYSTERECTOMY     partial    TUBAL LIGATION     WISDOM TOOTH EXTRACTION     one tooth removed    ROS: Review of Systems Negative except as stated above  PHYSICAL EXAM: BP (!) 139/102   Pulse 88   Temp 98 F (36.7 C) (Oral)   Resp 16   Ht 5' 2 (1.575 m)   Wt 216 lb 6.4 oz (98.2 kg)   SpO2 97%   BMI 39.58 kg/m   Physical Exam HENT:     Head: Normocephalic and atraumatic.     Nose: Nose normal.     Mouth/Throat:     Mouth: Mucous membranes are moist.     Pharynx: Oropharynx is clear.  Eyes:     Extraocular Movements: Extraocular movements intact.     Conjunctiva/sclera: Conjunctivae normal.     Pupils: Pupils  are equal, round, and reactive to light.  Cardiovascular:     Rate and Rhythm: Normal rate and regular rhythm.     Pulses: Normal pulses.     Heart sounds: Normal heart sounds.  Pulmonary:     Effort: Pulmonary effort is normal.     Breath sounds: Normal breath sounds.  Musculoskeletal:        General: Normal range of motion.     Cervical back: Normal range of motion and neck supple.  Neurological:     General: No focal deficit  present.     Mental Status: She is alert and oriented to person, place, and time.  Psychiatric:        Mood and Affect: Mood normal.        Behavior: Behavior normal.     ASSESSMENT AND PLAN: 1. Primary hypertension (Primary) - Blood pressure not at goal during today's visit. Patient asymptomatic without chest pressure, chest pain, palpitations, shortness of breath, worst headache of life, and any additional red flag symptoms. - Patient intolerant to Amlodipine , Lisinopril, and Losartan. - Increase Hydrochlorothiazide  from 12.5 mg to 25 mg as prescribed.  - Counseled on blood pressure goal of less than 130/80, low-sodium, DASH diet, medication compliance, and 150 minutes of moderate intensity exercise per week as tolerated. Counseled on medication adherence and adverse effects. - Referral to Cardiology for evaluation/management.  - Follow-up with primary provider in 4 weeks or sooner if needed,. - hydrochlorothiazide  (HYDRODIURIL ) 25 MG tablet; Take 1 tablet (25 mg total) by mouth daily.  Dispense: 90 tablet; Refill: 0 - Ambulatory referral to Cardiology  2. Type 2 diabetes mellitus with hyperglycemia, without long-term current use of insulin (HCC) - Hemoglobin A1c 7.0% on 10/25/2024. Goal 7%.  - Continue Glimepiride  as prescribed.  - Trial Tirzepatide as prescribed.  - Discussed the importance of healthy eating habits, low-carbohydrate diet, low-sugar diet, regular aerobic exercise (at least 150 minutes a week as tolerated) and medication compliance to  achieve or maintain control of diabetes. Counseled on medication adherence/adverse effects.  - Follow-up with primary provider in 4 weeks or sooner if needed. - glimepiride  (AMARYL ) 4 MG tablet; Take 2 tablets (8 mg total) by mouth daily before breakfast.  Dispense: 180 tablet; Refill: 0 - tirzepatide (MOUNJARO) 2.5 MG/0.5ML Pen; Inject 2.5 mg into the skin once a week.  Dispense: 6 mL; Refill: 0  3. Weight gain - Referral to Medical Weight Management for evaluation/management.  - Amb Ref to Medical Weight Management  Patient was given the opportunity to ask questions.  Patient verbalized understanding of the plan and was able to repeat key elements of the plan. Patient was given clear instructions to go to Emergency Department or return to medical center if symptoms don't improve, worsen, or new problems develop.The patient verbalized understanding.   Orders Placed This Encounter  Procedures   Ambulatory referral to Cardiology   Amb Ref to Medical Weight Management     Requested Prescriptions   Signed Prescriptions Disp Refills   hydrochlorothiazide  (HYDRODIURIL ) 25 MG tablet 90 tablet 0    Sig: Take 1 tablet (25 mg total) by mouth daily.   glimepiride  (AMARYL ) 4 MG tablet 180 tablet 0    Sig: Take 2 tablets (8 mg total) by mouth daily before breakfast.   tirzepatide (MOUNJARO) 2.5 MG/0.5ML Pen 6 mL 0    Sig: Inject 2.5 mg into the skin once a week.    Return in about 4 weeks (around 12/22/2024) for Follow-Up or next available chronic conditions.  Greig JINNY Chute, NP

## 2024-11-24 NOTE — Progress Notes (Signed)
 Follow up.

## 2024-12-22 ENCOUNTER — Institutional Professional Consult (permissible substitution) (INDEPENDENT_AMBULATORY_CARE_PROVIDER_SITE_OTHER): Admitting: Otolaryngology

## 2024-12-29 ENCOUNTER — Encounter: Payer: Self-pay | Admitting: Family

## 2024-12-29 ENCOUNTER — Ambulatory Visit: Admitting: Family

## 2024-12-29 VITALS — BP 124/85 | HR 82 | Wt 216.4 lb

## 2024-12-29 DIAGNOSIS — I10 Essential (primary) hypertension: Secondary | ICD-10-CM | POA: Diagnosis not present

## 2024-12-29 DIAGNOSIS — E1165 Type 2 diabetes mellitus with hyperglycemia: Secondary | ICD-10-CM

## 2024-12-29 DIAGNOSIS — Z7985 Long-term (current) use of injectable non-insulin antidiabetic drugs: Secondary | ICD-10-CM

## 2024-12-29 DIAGNOSIS — Z7984 Long term (current) use of oral hypoglycemic drugs: Secondary | ICD-10-CM | POA: Diagnosis not present

## 2024-12-29 MED ORDER — TIRZEPATIDE 2.5 MG/0.5ML ~~LOC~~ SOAJ: 2.5000 mg | SUBCUTANEOUS | 0 refills | Status: AC

## 2024-12-29 MED ORDER — HYDROCHLOROTHIAZIDE 25 MG PO TABS: 25.0000 mg | ORAL_TABLET | Freq: Every day | ORAL | 0 refills | Status: AC

## 2024-12-29 MED ORDER — GLIMEPIRIDE 4 MG PO TABS: 8.0000 mg | ORAL_TABLET | Freq: Every day | ORAL | 0 refills | Status: AC

## 2024-12-29 NOTE — Progress Notes (Signed)
 "   Patient ID: Kelly Cooper, female    DOB: 1971-09-14  MRN: [id number]  CC: Chronic Conditions Follow-Up  Subjective: Kelly Cooper is a 54 y.o. female who presents for chronic conditions follow-up.   Her concerns today include:  - Doing well on Hydrochlorothiazide , no issues/concerns. States since previous office visit she did not establish with Cardiology. She does not complain of red flag symptoms such as but not limited to chest pain, shortness of breath, worst headache of life, nausea/vomiting.  - Doing well on Glimepiride  and Tirzepatide , no issues/concerns. States she has only been taking Tirzepatide  for 2 weeks. States initially the same caused her to feel queasy but eventually resolved. She denies red flag symptoms associated with diabetes.  - Upcoming appointment with ENT. - Upcoming appointment with Medical Weight Management.  - Plans to schedule appointment with Dentistry soon.   Patient Active Problem List   Diagnosis Date Noted   Diverticulosis of colon without hemorrhage 07/23/2015   Dysphagia 07/23/2015     Medications Ordered Prior to Encounter[1]  Allergies[2]  Social History   Socioeconomic History   Marital status: Single    Spouse name: Not on file   Number of children: 3   Years of education: Not on file   Highest education level: 12th grade  Occupational History   Occupation: surveyor, minerals  Tobacco Use   Smoking status: Never   Smokeless tobacco: Never  Substance and Sexual Activity   Alcohol use: Yes    Alcohol/week: 0.0 standard drinks of alcohol    Comment: occ   Drug use: No   Sexual activity: Not on file  Other Topics Concern   Not on file  Social History Narrative   Not on file   Social Drivers of Health   Tobacco Use: Low Risk (12/29/2024)   Patient History    Smoking Tobacco Use: Never    Smokeless Tobacco Use: Never    Passive Exposure: Not on file  Financial Resource Strain: Medium Risk (05/03/2024)   Overall  Financial Resource Strain (CARDIA)    Difficulty of Paying Living Expenses: Somewhat hard  Food Insecurity: Food Insecurity Present (05/03/2024)   Hunger Vital Sign    Worried About Running Out of Food in the Last Year: Often true    Ran Out of Food in the Last Year: Often true  Transportation Needs: No Transportation Needs (05/03/2024)   PRAPARE - Administrator, Civil Service (Medical): No    Lack of Transportation (Non-Medical): No  Physical Activity: Inactive (09/13/2024)   Exercise Vital Sign    Days of Exercise per Week: 0 days    Minutes of Exercise per Session: 0 min  Stress: Stress Concern Present (05/03/2024)   [employer] of Occupational Health - Occupational Stress Questionnaire    Feeling of Stress : Rather much  Social Connections: Unknown (05/03/2024)   Social Connection and Isolation Panel    Frequency of Communication with Friends and Family: More than three times a week    Frequency of Social Gatherings with Friends and Family: Twice a week    Attends Religious Services: Patient declined    Database Administrator or Organizations: No    Attends Banker Meetings: Not on file    Marital Status: Patient declined  Intimate Partner Violence: Not At Risk (05/31/2024)   Epic    Fear of Current or Ex-Partner: No    Emotionally Abused: No    Physically Abused: No    Sexually  Abused: No  Depression (PHQ2-9): Low Risk (12/29/2024)   Depression (PHQ2-9)    PHQ-2 Score: 0  Alcohol Screen: Low Risk (05/03/2024)   Alcohol Screen    Last Alcohol Screening Score (AUDIT): 1  Housing: High Risk (05/03/2024)   Housing Stability Vital Sign    Unable to Pay for Housing in the Last Year: Yes    Number of Times Moved in the Last Year: 0    Homeless in the Last Year: No  Utilities: Not At Risk (05/31/2024)   Epic    Threatened with loss of utilities: No  Health Literacy: Adequate Health Literacy (05/31/2024)   B1300 Health Literacy    Frequency of need for  help with medical instructions: Never    Family History  Problem Relation Age of Onset   Diabetes Maternal Grandfather    Colon cancer Maternal Grandfather    Prostate cancer Maternal Grandfather    Diabetes Paternal Aunt    Diabetes Paternal Uncle    Kidney disease Father    Lung cancer Father    Esophageal cancer Neg Hx    Stomach cancer Neg Hx    Rectal cancer Neg Hx     Past Surgical History:  Procedure Laterality Date   ABDOMINAL HYSTERECTOMY     partial    TUBAL LIGATION     WISDOM TOOTH EXTRACTION     one tooth removed    ROS: Review of Systems Negative except as stated above  PHYSICAL EXAM: BP 124/85 (BP Location: Right Arm, Patient Position: Sitting, Cuff Size: Normal)   Pulse 82   Wt 216 lb 6.4 oz (98.2 kg)   SpO2 92%   BMI 39.58 kg/m   Physical Exam HENT:     Head: Normocephalic and atraumatic.     Nose: Nose normal.     Mouth/Throat:     Mouth: Mucous membranes are moist.     Pharynx: Oropharynx is clear.  Eyes:     Extraocular Movements: Extraocular movements intact.     Conjunctiva/sclera: Conjunctivae normal.     Pupils: Pupils are equal, round, and reactive to light.  Cardiovascular:     Rate and Rhythm: Normal rate and regular rhythm.     Pulses: Normal pulses.     Heart sounds: Normal heart sounds.  Pulmonary:     Effort: Pulmonary effort is normal.     Breath sounds: Normal breath sounds.  Musculoskeletal:        General: Normal range of motion.     Cervical back: Normal range of motion and neck supple.  Neurological:     General: No focal deficit present.     Mental Status: She is alert and oriented to person, place, and time.  Psychiatric:        Mood and Affect: Mood normal.        Behavior: Behavior normal.     ASSESSMENT AND PLAN: 1. Primary hypertension (Primary) - Continue Hydrochlorothiazide  as prescribed.  - Counseled on blood pressure goal of less than 130/80, low-sodium, DASH diet, medication compliance, and 150  minutes of moderate intensity exercise per week as tolerated. Counseled on medication adherence and adverse effects. - Referral to Cardiology for evaluation/management.  - Follow-up with primary provider in 3 months or sooner if needed. - hydrochlorothiazide  (HYDRODIURIL ) 25 MG tablet; Take 1 tablet (25 mg total) by mouth daily.  Dispense: 90 tablet; Refill: 0 - Ambulatory referral to Cardiology  2. Type 2 diabetes mellitus with hyperglycemia, without long-term current use of insulin (HCC) -  Hemoglobin A1c 7.0% on 10/25/2024. Goal 7%,. - Continue Tirzepatide  and Glimepiride  as prescribed.  - Discussed the importance of healthy eating habits, low-carbohydrate diet, low-sugar diet, regular aerobic exercise (at least 150 minutes a week as tolerated) and medication compliance to achieve or maintain control of diabetes. Counseled on medication adherence/adverse effects.  - Follow-up with primary provider in 4 weeks or sooner if needed.  - tirzepatide  (MOUNJARO ) 2.5 MG/0.5ML Pen; Inject 2.5 mg into the skin once a week.  Dispense: 6 mL; Refill: 0 - glimepiride  (AMARYL ) 4 MG tablet; Take 2 tablets (8 mg total) by mouth daily before breakfast.  Dispense: 180 tablet; Refill: 0  Patient was given the opportunity to ask questions.  Patient verbalized understanding of the plan and was able to repeat key elements of the plan. Patient was given clear instructions to go to Emergency Department or return to medical center if symptoms don't improve, worsen, or new problems develop.The patient verbalized understanding.   Orders Placed This Encounter  Procedures   Ambulatory referral to Cardiology     Requested Prescriptions   Signed Prescriptions Disp Refills   hydrochlorothiazide  (HYDRODIURIL ) 25 MG tablet 90 tablet 0    Sig: Take 1 tablet (25 mg total) by mouth daily.   tirzepatide  (MOUNJARO ) 2.5 MG/0.5ML Pen 6 mL 0    Sig: Inject 2.5 mg into the skin once a week.   glimepiride  (AMARYL ) 4 MG tablet 180  tablet 0    Sig: Take 2 tablets (8 mg total) by mouth daily before breakfast.    Return in about 4 weeks (around 01/26/2025) for Follow-Up or next available chronic conditions.  Greig JINNY Chute, NP      [1]  Current Outpatient Medications on File Prior to Visit  Medication Sig Dispense Refill   amLODipine  (NORVASC ) 10 MG tablet Take 1 tablet (10 mg total) by mouth daily. 90 tablet 0   gabapentin  (NEURONTIN ) 100 MG capsule Take 1 capsule (100 mg total) by mouth 3 (three) times daily. 90 capsule 6   glimepiride  (AMARYL ) 2 MG tablet Take 1 tablet (2 mg total) by mouth daily. 90 tablet 0   JARDIANCE 25 MG TABS tablet Take 25 mg by mouth daily.     amoxicillin -clavulanate (AUGMENTIN ) 875-125 MG tablet Take 1 tablet by mouth 2 (two) times daily. (Patient not taking: Reported on 08/03/2024) 20 tablet 0   amoxicillin -clavulanate (AUGMENTIN ) 875-125 MG tablet Take 1 tablet by mouth 2 (two) times daily. (Patient not taking: Reported on 11/24/2024) 20 tablet 0   esomeprazole (NEXIUM) 40 MG capsule Take 1 capsule (40 mg total) by mouth daily at 12 noon. 30 minutes before breakfast 30 capsule 3   gabapentin  (NEURONTIN ) 100 MG capsule Take 1 capsule (100 mg total) by mouth 3 (three) times daily. (Patient not taking: Reported on 09/13/2024) 90 capsule 3   gabapentin  (NEURONTIN ) 300 MG capsule Take 300 mg by mouth daily. (Patient not taking: Reported on 08/03/2024)     glimepiride  (AMARYL ) 4 MG tablet Take 1 tablet (4 mg total) by mouth daily with breakfast. (Patient not taking: Reported on 08/03/2024) 90 tablet 0   phentermine  30 MG capsule Take 1 capsule (30 mg total) by mouth every morning. 30 capsule 0   No current facility-administered medications on file prior to visit.  [2]  Allergies Allergen Reactions   Lisinopril Other (See Comments)   Losartan Cough    Hoarseness and coughing and weight loss   "

## 2024-12-30 ENCOUNTER — Ambulatory Visit (INDEPENDENT_AMBULATORY_CARE_PROVIDER_SITE_OTHER): Admitting: Nurse Practitioner

## 2024-12-30 ENCOUNTER — Encounter (INDEPENDENT_AMBULATORY_CARE_PROVIDER_SITE_OTHER): Payer: Self-pay | Admitting: Nurse Practitioner

## 2024-12-30 VITALS — BP 106/72 | HR 87 | Temp 97.9°F | Ht 62.0 in | Wt 211.0 lb

## 2024-12-30 DIAGNOSIS — Z6838 Body mass index (BMI) 38.0-38.9, adult: Secondary | ICD-10-CM | POA: Diagnosis not present

## 2024-12-30 DIAGNOSIS — I10 Essential (primary) hypertension: Secondary | ICD-10-CM | POA: Diagnosis not present

## 2024-12-30 DIAGNOSIS — E1165 Type 2 diabetes mellitus with hyperglycemia: Secondary | ICD-10-CM | POA: Diagnosis not present

## 2024-12-30 DIAGNOSIS — Z7985 Long-term (current) use of injectable non-insulin antidiabetic drugs: Secondary | ICD-10-CM | POA: Diagnosis not present

## 2024-12-30 DIAGNOSIS — E66812 Obesity, class 2: Secondary | ICD-10-CM | POA: Diagnosis not present

## 2024-12-30 NOTE — Progress Notes (Signed)
 " [address] [place], [place], [place] [address] Office: [phone]  /  Fax: [phone]   Initial Consultation    Kelly Cooper was seen in clinic today to evaluate for obesity. She is interested in losing weight to improve overall health and reduce the risk of weight related complications. She presents today to review program treatment options, initial physical assessment, and evaluation.    She works 10-7  shifts 5 days a week. She is on her feet a lot, works as a aeronautical engineer at [employer].   Kelly Cooper does have hypertension and her BP's are currently well controlled on hydrochlorothiazide  25 mg every day. Denies headaches, chest pain, shortness of breath at rest and dizziness  BP Readings from Last 3 Encounters:  12/30/24 106/72  12/29/24 124/85  11/24/24 (!) 139/102   She does have Type 2 Diabetes Mellitus and is currently on Glimepiride  4 mg 2 tabs before breakfast . Started recently on Mounjaro  2.5 mg SQ QW - she took 3rd shot today. She will have a little queasiness on and off Lab Results  Component Value Date   HGBA1C 7.0 (H) 10/25/2024    Anthropometrics and Bioimpedance Analysis   Body mass index is 38.59 kg/m. Body Fat Mass : 44.2 % Visceral Fat Mass Rating : 13  Obesity Related Diseases and Complications  Obesity Quality of Life and Psychosocial Complications: Body image dissatisfaction, Reduced health-related quality of life, and Decrease physical activity and social participation  Cardiometabolic: Type 2 diabetes, Hypertension, DOE, and Fatigue  Biomechanical: Low back pain   Weight Related History  She was referred by: PCP  When asked what they would like to accomplish? She states: Adopt a healthier eating pattern and lifestyle, Improve energy levels and physical activity, Improve existing medical conditions, Improve quality of life, and Lose 60 lbs  Weight history: She first noticed weight gain in her 20's after birth of children. She did not  get back to prepregnancy weight with her children- has 2 boys and 1 girl.   Highest weight: 236  Contributing factors: family history of obesity, consumption of processed foods, use of obesogenic medications: Antiepileptics, moderate to high levels of stress, reduced physical activity, chronic skipping of meals, menopause, need for convenience due to lack of time, multiple weight loss attempts in the past, hectic pace of life, and need for convenient foods  Prior weight loss attempts: None  Current or previous pharmacotherapy: GLP-1, Metformin, and Phentermine - diarrhea with Metformin  Response to medication: Had side effects so it was discontinued and Lost weight initially but was unable to sustain weight loss  Current nutrition plan: High-protein and Portion control / smart choices  Greatest challenge with dieting: never tried dieting before.  Current level of physical activity: None  Barriers to Exercise: time and does not enjoy  Readiness and Motivation  On a scale from 0 to 10 How ready are you to make changes to your eating and physical activity to lose weight? 10 How important is it for you to lose weight right now ? 10 How confident are you that you can lose weight if you try? 8  Past Medical History   Past Medical History:  Diagnosis Date   Anemia    Diabetes mellitus without complication (HCC)    Diverticulosis of intestine 01/02/2015   GERD (gastroesophageal reflux disease) 01/23/2015   Hypertension      Objective    BP 106/72   Pulse 87   Temp 97.9 F (36.6 C)   Ht  5' 2 (1.575 m)   Wt 211 lb (95.7 kg)   SpO2 99%   BMI 38.59 kg/m  She was weighed on the bioimpedance scale: Body mass index is 38.59 kg/m.    General:  Alert, oriented and cooperative. Patient is in no acute distress.  Respiratory: Normal respiratory effort, no problems with respiration noted   Gait: able to ambulate independently  Mental Status: Normal mood and affect. Normal behavior.  Normal judgment and thought content.   Diagnostic Data Reviewed  BMET    Component Value Date/Time   NA 142 10/25/2024 1430   K 4.0 10/25/2024 1430   CL 101 10/25/2024 1430   CO2 23 10/25/2024 1430   GLUCOSE 55 (L) 10/25/2024 1430   GLUCOSE 74 10/26/2013 1156   BUN 16 10/25/2024 1430   CREATININE 0.85 10/25/2024 1430   CALCIUM 10.4 (H) 10/25/2024 1430   GFRNONAA >90 10/26/2013 1156   GFRAA >90 10/26/2013 1156   Lab Results  Component Value Date   HGBA1C 7.0 (H) 10/25/2024   HGBA1C 7.1 (H) 05/03/2024   No results found for: INSULIN CBC    Component Value Date/Time   WBC 13.2 (H) 10/26/2013 1530   RBC 4.77 10/26/2013 1530   HGB 13.3 10/26/2013 1530   HCT 38.6 10/26/2013 1530   PLT 316 10/26/2013 1530   MCV 80.9 10/26/2013 1530   MCH 27.9 10/26/2013 1530   MCHC 34.5 10/26/2013 1530   RDW 14.3 10/26/2013 1530   Iron/TIBC/Ferritin/ %Sat No results found for: IRON, TIBC, FERRITIN, IRONPCTSAT Lipid Panel  No results found for: CHOL, TRIG, HDL, CHOLHDL, VLDL, LDLCALC, LDLDIRECT Hepatic Function Panel     Component Value Date/Time   PROT 7.5 10/26/2013 1156   ALBUMIN 3.8 10/26/2013 1156   AST 25 10/26/2013 1156   ALT 20 10/26/2013 1156   ALKPHOS 62 10/26/2013 1156   BILITOT 0.4 10/26/2013 1156      Component Value Date/Time   TSH 0.773 10/25/2024 1430    Medications  Outpatient Encounter Medications as of 12/30/2024  Medication Sig Note   esomeprazole (NEXIUM) 40 MG capsule Take 1 capsule (40 mg total) by mouth daily at 12 noon. 30 minutes before breakfast    gabapentin  (NEURONTIN ) 100 MG capsule Take 1 capsule (100 mg total) by mouth 3 (three) times daily.    glimepiride  (AMARYL ) 4 MG tablet Take 2 tablets (8 mg total) by mouth daily before breakfast.    hydrochlorothiazide  (HYDRODIURIL ) 25 MG tablet Take 1 tablet (25 mg total) by mouth daily.    tirzepatide  (MOUNJARO ) 2.5 MG/0.5ML Pen Inject 2.5 mg into the skin once a week.     [DISCONTINUED] amLODipine  (NORVASC ) 10 MG tablet Take 1 tablet (10 mg total) by mouth daily.    [DISCONTINUED] amoxicillin -clavulanate (AUGMENTIN ) 875-125 MG tablet Take 1 tablet by mouth 2 (two) times daily.    [DISCONTINUED] amoxicillin -clavulanate (AUGMENTIN ) 875-125 MG tablet Take 1 tablet by mouth 2 (two) times daily.    [DISCONTINUED] gabapentin  (NEURONTIN ) 100 MG capsule Take 1 capsule (100 mg total) by mouth 3 (three) times daily.    [DISCONTINUED] gabapentin  (NEURONTIN ) 300 MG capsule Take 300 mg by mouth daily. 05/03/2024: As needed   [DISCONTINUED] glimepiride  (AMARYL ) 2 MG tablet Take 1 tablet (2 mg total) by mouth daily.    [DISCONTINUED] glimepiride  (AMARYL ) 4 MG tablet Take 1 tablet (4 mg total) by mouth daily with breakfast.    [DISCONTINUED] JARDIANCE 25 MG TABS tablet Take 25 mg by mouth daily.    [DISCONTINUED] phentermine  30  MG capsule Take 1 capsule (30 mg total) by mouth every morning.    No facility-administered encounter medications on file as of 12/30/2024.     Assessment and Plan   Primary hypertension Continue hydrochlorothiazide  25 mg every day Focus on DASH diet Monitor BP and if consistently >140/90 notify PCP If develops headaches, chest pain, shortness of breath or dizziness go to ER Loss of 10-15% body weight can help improve blood pressures   Type 2 diabetes mellitus with hyperglycemia, without long-term current use of insulin (HCC) Limit simple carbohydrates. Continue Mounjaro  2.5 mg SQ QW and Glimepiride  4 mg 2 tabs qam after breakfast Continue to follow regularly with PCP Decreasing body weight by 10-15% can improve glucose levels  Class 2 severe obesity with serious comorbidity and body mass index (BMI) of 39.0 to 39.9 in adult, unspecified obesity type Obesity Treatment and Action Plan:  Patient will work on garnering support from family and friends to begin weight loss journey. Will work on eliminating or reducing the presence of highly palatable,  calorie dense foods in the home. Will complete provided nutritional and psychosocial assessment questionnaire before the next appointment. Will be scheduled for indirect calorimetry to determine resting energy expenditure in a fasting state.  This will allow us  to create a reduced calorie, high-protein meal plan to promote loss of fat mass while preserving muscle mass. Counseled on the health benefits of losing 5%-15% of total body weight. Was counseled on nutritional approaches to weight loss and benefits of reducing processed foods and consuming plant-based foods and high quality protein as part of nutritional weight management. Was counseled on pharmacotherapy and role as an adjunct in weight management.   Education and Additional resources  She was weighed on the bioimpedance scale and results were discussed and documented in the synopsis.  We discussed obesity as a progressive, chronic disease and the importance of a more detailed evaluation of all the factors contributing to the disease.  We reviewed the basic principles in obesity management.   We discussed the importance of long term lifestyle changes which include nutrition, exercise and behavioral modification as well as the importance of customizing this to her specific health and social needs.  We reviewed the role of medical interventions including pharmacotherapy and surgical interventions.   We discussed the benefits of reaching a healthier weight to alleviate the symptoms of existing conditions and reduce the risks of the biomechanical, cardiometabolic and psychological effects of obesity.  We reviewed our program approach and philosophy, which are guided by the four pillars of obesity medicine.  We discussed how to prepare for intake appointment and the importance of fasting and avoidance of stimulants for at least 8 hours prior to indirect calorimetry.  Kelly Cooper appears to be in the action stage of change and reports  being ready to initiate intensive lifestyle and behavioral modifications as part of their weight loss journey.  Attestation  Reviewed by clinician on day of visit: allergies, medications, problem list, medical history, surgical history, family history, social history, and previous encounter notes pertinent to obesity diagnosis.  I personally spent a total of 30 minutes in the care of the patient today including preparing to see the patient, getting/reviewing separately obtained history, performing a medically appropriate exam/evaluation, counseling and educating, and documenting clinical information in the EHR.   Lonell Liverpool ANP-C "

## 2025-01-02 NOTE — Progress Notes (Unsigned)
 " Cardiology Office Note:  .   Date:  01/06/2025  ID:  Kelly Cooper, DOB 1971-05-19, MRN [id number] PCP: Jaycee Greig PARAS, NP  [facility] Providers Cardiologist:  None   History of Present Illness: .    Chief Complaint  Patient presents with   Hyperlipidemia    Kelly Cooper is a 54 y.o. female with below history who presents for the evaluation of abnormal EKG at the request of Jaycee Greig PARAS, NP.   History of Present Illness   Kelly Cooper is a 54 year old female with hypertension, diabetes, and hyperlipidemia who presents for evaluation of her cardiovascular risk.  She has a history of diabetes, diagnosed in 2016, and hypertension, both of which are currently managed. Her last A1c was 7.0 in November, and she has recently started taking Mounjaro . Previously, she managed her diabetes with supplements and had a period without prescribed medication.  She has a history of hyperlipidemia but has not had her cholesterol checked since April or May of the previous year. She is not currently on any cholesterol medication. She underwent a stress test and heart imaging around 2016 or 2017, with no subsequent cardiac events reported.  Her current medications include gabapentin  for neuropathy, glimepiride , hydrochlorothiazide , and Mounjaro . 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 She is separated, has three children who are out of the house, and seven grandchildren.  In terms of social history, she does not smoke, consumes alcohol socially, and denies drug use. She acknowledges not being as physically active as she would like, citing her work schedule as a barrier to regular exercise, although she walks frequently at work.  No chest pain, trouble breathing, history of heart attack, or stroke. No family history of heart disease. Both parents are deceased with no reported heart troubles.            Problem List DM -A1c 7.0 HTN Obesity  -BMI 38    ROS: All other ROS reviewed and negative. Pertinent positives noted in the HPI.     Studies Reviewed: SABRA   EKG Interpretation Date/Time:  Thursday January 06 2025 09:18:04 EST Ventricular Rate:  88 PR Interval:  180 QRS Duration:  76 QT Interval:  360 QTC Calculation: 435 R Axis:   23  Text Interpretation: Normal sinus rhythm Low voltage QRS Confirmed by Barbaraann Kotyk [phone]) on 01/06/2025 9:20:23 AM   Physical Exam:   VS:  BP 138/88   Pulse 72   Ht 5' 2 (1.575 m)   Wt 216 lb 4.8 oz (98.1 kg)   SpO2 98%   BMI 39.56 kg/m    Wt Readings from Last 3 Encounters:  01/06/25 216 lb 4.8 oz (98.1 kg)  12/30/24 211 lb (95.7 kg)  12/29/24 216 lb 6.4 oz (98.2 kg)    GEN: Well nourished, well developed in no acute distress NECK: No JVD; No carotid bruits CARDIAC: RRR, no murmurs, rubs, gallops RESPIRATORY:  Clear to auscultation without rales, wheezing or rhonchi  ABDOMEN: Soft, non-tender, non-distended EXTREMITIES:  No edema; No deformity  ASSESSMENT AND PLAN: .   Assessment and Plan    Hyperlipidemia Diabetes mellitus increases cardiovascular risk. Last cholesterol check over a year ago. No current cholesterol medication. EKG normal, no cardiac complaints. Discussed calcium score test, she prefers to defer. - Ordered lipid profile. - Consider statin based on lipid profile results. - Discussed calcium score test  for future consideration.                Follow-up: Return if symptoms worsen or fail to improve.  Signed, Darryle DASEN. Barbaraann, MD, [facility]  [facility]  [address] [place], [place] [address] [phone]  9:39 AM   "

## 2025-01-06 ENCOUNTER — Encounter: Payer: Self-pay | Admitting: Cardiovascular Disease

## 2025-01-06 ENCOUNTER — Ambulatory Visit: Attending: Cardiology | Admitting: Cardiovascular Disease

## 2025-01-06 VITALS — BP 138/88 | HR 72 | Ht 62.0 in | Wt 216.3 lb

## 2025-01-06 DIAGNOSIS — I150 Renovascular hypertension: Secondary | ICD-10-CM | POA: Diagnosis not present

## 2025-01-06 DIAGNOSIS — E669 Obesity, unspecified: Secondary | ICD-10-CM | POA: Diagnosis not present

## 2025-01-06 DIAGNOSIS — R9431 Abnormal electrocardiogram [ECG] [EKG]: Secondary | ICD-10-CM | POA: Diagnosis not present

## 2025-01-06 LAB — LIPID PANEL
Chol/HDL Ratio: 3.5 ratio (ref 0.0–4.4)
Cholesterol, Total: 188 mg/dL (ref 100–199)
HDL: 53 mg/dL
LDL Chol Calc (NIH): 107 mg/dL — ABNORMAL HIGH (ref 0–99)
Triglycerides: 159 mg/dL — ABNORMAL HIGH (ref 0–149)
VLDL Cholesterol Cal: 28 mg/dL (ref 5–40)

## 2025-01-06 NOTE — Patient Instructions (Signed)
 Medication Instructions:   Your physician recommends that you continue on your current medications as directed. Please refer to the Current Medication list given to you today.   *If you need a refill on your cardiac medications before your next appointment, please call your pharmacy*    Lab Work:   PLEASE GO DOWN STAIRS  [facility]  FIRST FLOOR   ( GET OFF ELEVATORS WALK TOWARDS WAITING AREA LAB LOCATED BY PHARMACY):  LIPIDS      If you have labs (blood work) drawn today and your tests are completely normal, you will receive your results only by: MyChart Message (if you have MyChart) OR A paper copy in the mail If you have any lab test that is abnormal or we need to change your treatment, we will call you to review the results.    Testing/Procedures: NONE ORDERED  TODAY     Follow-Up: At [facility], you and your health needs are our priority.  As part of our continuing mission to provide you with exceptional heart care, our providers are all part of one team.  This team includes your primary Cardiologist (physician) and Advanced Practice Providers or APPs (Physician Assistants and Nurse Practitioners) who all work together to provide you with the care you need, when you need it.  Your next appointment:  CONTACT [facility] [phone] AS NEEDED FOR  ANY CARDIAC RELATED SYMPTOMS    We recommend signing up for the patient portal called MyChart.  Sign up information is provided on this After Visit Summary.  MyChart is used to connect with patients for Virtual Visits (Telemedicine).  Patients are able to view lab/test results, encounter notes, upcoming appointments, etc.  Non-urgent messages can be sent to your provider as well.   To learn more about what you can do with MyChart, go to [email or web address].    Other Instructions

## 2025-01-07 ENCOUNTER — Ambulatory Visit: Payer: Self-pay | Admitting: Cardiovascular Disease

## 2025-01-11 ENCOUNTER — Encounter (INDEPENDENT_AMBULATORY_CARE_PROVIDER_SITE_OTHER): Payer: Self-pay | Admitting: Otolaryngology

## 2025-01-11 ENCOUNTER — Ambulatory Visit (INDEPENDENT_AMBULATORY_CARE_PROVIDER_SITE_OTHER): Admitting: Otolaryngology

## 2025-01-11 VITALS — BP 118/83 | HR 82 | Ht 62.0 in | Wt 211.0 lb

## 2025-01-11 DIAGNOSIS — K146 Glossodynia: Secondary | ICD-10-CM

## 2025-01-11 DIAGNOSIS — J3501 Chronic tonsillitis: Secondary | ICD-10-CM | POA: Diagnosis not present

## 2025-01-11 DIAGNOSIS — E041 Nontoxic single thyroid nodule: Secondary | ICD-10-CM

## 2025-01-11 DIAGNOSIS — K219 Gastro-esophageal reflux disease without esophagitis: Secondary | ICD-10-CM

## 2025-01-11 MED ORDER — ATORVASTATIN CALCIUM 20 MG PO TABS: 20.0000 mg | ORAL_TABLET | Freq: Every day | ORAL | 3 refills | Status: AC

## 2025-01-11 NOTE — Progress Notes (Addendum)
 Reason for Consult: Chronic tonsillitis Referring Physician: Dr. Massey  Kelly Cooper is an 54 y.o. female.  HPI: She is here for evaluation of several things.  She has had a thyroid  nodule on the right side for years.  9 years ago she had it aspirated and it was a clear fluid and it was benign.  That reduced the size.  She now feels like there is maybe a little bit more fullness in the area.  She has no dysphagia or odynophagia.  Her thyroid  hormones are normal.  She basically would say she is asymptomatic from the nodule.  She recently had an ultrasound which showed the nodule at 2.8 cm which was smaller than the previous ultrasound.  The other nodules are without criteria for any biopsy and this 2.81 with a stable finding for 9 years does not meet the criteria either.  She also has tonsil stones.  She gets these periodically.  Maybe 3 or 4 times a year it last for about a week or so.  It does not bother her in a significant fashion.  She has not had antibiotics.  She also has a burning tongue feeling.  This has been going on for about 3 months.  It was occurring quite frequently maybe 3 times a week and now she has not had it in the last 2 weeks.  She does have a bad tooth and she was not sure if that was the culprit.  She previously had a wisdom tooth removed which caused numbness of her mental nerve.  This was years ago.  She does have significant reflux problems in the past.  She does have heartburn and indigestion occasionally.  She has a few other reflux symptoms.  She used to be on Nexium but is currently not.  Past Medical History:  Diagnosis Date   Anemia    Diabetes mellitus without complication (HCC)    Diverticulosis of intestine 01/02/2015   GERD (gastroesophageal reflux disease) 01/23/2015   Hypertension     Past Surgical History:  Procedure Laterality Date   ABDOMINAL HYSTERECTOMY     partial    TUBAL LIGATION     WISDOM TOOTH EXTRACTION     one tooth removed    Family  History  Problem Relation Age of Onset   Kidney disease Father    Lung cancer Father    Diabetes Paternal Aunt    Diabetes Paternal Uncle    Diabetes Maternal Grandfather    Colon cancer Maternal Grandfather    Prostate cancer Maternal Grandfather    Esophageal cancer Neg Hx    Stomach cancer Neg Hx    Rectal cancer Neg Hx     Social History:  reports that she has never smoked. She has never used smokeless tobacco. She reports current alcohol use. She reports that she does not use drugs.  Allergies: Allergies[1]   No results found for this or any previous visit (from the past 48 hours).  No results found.  ROS Height 5' 2 (1.575 m), weight 211 lb (95.7 kg). Physical Exam Constitutional:      Appearance: Normal appearance.  HENT:     Head: Normocephalic and atraumatic.     Right Ear: Tympanic membrane is without lesions and middle ear aerated, ear canal and external ear normal.     Left Ear: Tympanic membrane is without lesions and middle ear aerated, ear canal and external ear normal.     Nose: Nose without deviation of septum.  Turbinates with  mild hypertrophy, No significant swelling or masses.     Oral cavity/oropharynx: Tonsils are about +2-3 and very cryptic but no exudate or erythema.  No current cryptic debris visible.  Mucous membranes are moist. No lesions or masses    Larynx: normal voice. Mirror attempted without success    Eyes:     Extraocular Movements: Extraocular movements intact.     Conjunctiva/sclera: Conjunctivae normal.     Pupils: Pupils are equal, round, and reactive to light.  Cardiovascular:     Rate and Rhythm: Normal rate.  Pulmonary:     Effort: Pulmonary effort is normal.  Musculoskeletal:     Cervical back: Normal range of motion and neck supple. No rigidity.  Lymphadenopathy:     Cervical: No cervical adenopathy or masses.salivary glands without lesions. .  There is a palpable nodule in the right thyroid  lobe that is about the size of the  ultrasound finding of 3 cm    Salivary glands- no mass or swelling Neurological:     Mental Status: He is alert. CN 2-12 intact. 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  If it gets worse she will call and get a follow-up.  Antibiotics would be the first choice before discussion of surgery  Thyroid  nodule-right now she has a 2.8 cm nodule that because it is stable for 9 years and previous benign pathology would not need to be needle biopsied by criteria but she also does not want to proceed with workup at this time.  She agrees to a another ultrasound in 1 to 2 years.  She is asymptomatic with regard to the nodule.  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  We discussed dental problems, reflux, and vitamin deficiency issues.  Right now she has not had the problem in 2 weeks so I think before we treat her for reflux she probably should know whether it is going to be a problem that she can judge the reflux treatment effectiveness.  If she continues with the burning tongue I would recommend getting back on the Nexium twice a day and try that for 1 month to see if it helps.  We then could drawl the lab work if she still is continuing with the issue.  Kelly Cooper Notice 01/11/2025, 4:10 PM         [1]  Allergies Allergen Reactions   Lisinopril Other (See Comments)   Losartan Cough    Hoarseness and coughing and weight loss

## 2025-02-02 ENCOUNTER — Ambulatory Visit: Payer: Self-pay | Admitting: Family

## 2025-02-15 ENCOUNTER — Ambulatory Visit: Admitting: Podiatry

## 2025-05-03 ENCOUNTER — Encounter: Admitting: Family
